# Patient Record
Sex: Female | Born: 1945 | Race: White | Hispanic: No | Smoking: Former smoker
Health system: Southern US, Community
[De-identification: ages and names within clinical notes are randomized; demographics above are authoritative.]

## PROBLEM LIST (undated history)

## (undated) DIAGNOSIS — Z8701 Personal history of pneumonia (recurrent): Secondary | ICD-10-CM

## (undated) DIAGNOSIS — Z8709 Personal history of other diseases of the respiratory system: Secondary | ICD-10-CM

## (undated) DIAGNOSIS — J302 Other seasonal allergic rhinitis: Secondary | ICD-10-CM

## (undated) DIAGNOSIS — Z9109 Other allergy status, other than to drugs and biological substances: Secondary | ICD-10-CM

## (undated) DIAGNOSIS — T7840XA Allergy, unspecified, initial encounter: Secondary | ICD-10-CM

## (undated) DIAGNOSIS — Z8739 Personal history of other diseases of the musculoskeletal system and connective tissue: Secondary | ICD-10-CM

## (undated) DIAGNOSIS — L732 Hidradenitis suppurativa: Secondary | ICD-10-CM

## (undated) DIAGNOSIS — M1990 Unspecified osteoarthritis, unspecified site: Secondary | ICD-10-CM

## (undated) DIAGNOSIS — Z860101 Personal history of adenomatous and serrated colon polyps: Secondary | ICD-10-CM

## (undated) DIAGNOSIS — Z8614 Personal history of Methicillin resistant Staphylococcus aureus infection: Secondary | ICD-10-CM

## (undated) DIAGNOSIS — R011 Cardiac murmur, unspecified: Secondary | ICD-10-CM

## (undated) DIAGNOSIS — M8580 Other specified disorders of bone density and structure, unspecified site: Secondary | ICD-10-CM

## (undated) DIAGNOSIS — K5790 Diverticulosis of intestine, part unspecified, without perforation or abscess without bleeding: Secondary | ICD-10-CM

## (undated) DIAGNOSIS — C801 Malignant (primary) neoplasm, unspecified: Secondary | ICD-10-CM

## (undated) DIAGNOSIS — J45909 Unspecified asthma, uncomplicated: Secondary | ICD-10-CM

## (undated) DIAGNOSIS — E785 Hyperlipidemia, unspecified: Secondary | ICD-10-CM

## (undated) DIAGNOSIS — R8271 Bacteriuria: Secondary | ICD-10-CM

## (undated) DIAGNOSIS — I10 Essential (primary) hypertension: Secondary | ICD-10-CM

## (undated) DIAGNOSIS — Z86010 Personal history of colonic polyps: Secondary | ICD-10-CM

## (undated) DIAGNOSIS — Z780 Asymptomatic menopausal state: Secondary | ICD-10-CM

## (undated) HISTORY — DX: Personal history of pneumonia (recurrent): Z87.01

## (undated) HISTORY — DX: Allergy, unspecified, initial encounter: T78.40XA

## (undated) HISTORY — DX: Hyperlipidemia, unspecified: E78.5

## (undated) HISTORY — DX: Personal history of adenomatous and serrated colon polyps: Z86.0101

## (undated) HISTORY — PX: TONSILLECTOMY: SHX5217

## (undated) HISTORY — DX: Diverticulosis of intestine, part unspecified, without perforation or abscess without bleeding: K57.90

## (undated) HISTORY — DX: Other seasonal allergic rhinitis: J30.2

## (undated) HISTORY — DX: Other allergy status, other than to drugs and biological substances: Z91.09

## (undated) HISTORY — DX: Hidradenitis suppurativa: L73.2

## (undated) HISTORY — DX: Malignant (primary) neoplasm, unspecified: C80.1

## (undated) HISTORY — DX: Unspecified asthma, uncomplicated: J45.909

## (undated) HISTORY — DX: Cardiac murmur, unspecified: R01.1

## (undated) HISTORY — DX: Essential (primary) hypertension: I10

## (undated) HISTORY — DX: Personal history of colonic polyps: Z86.010

## (undated) HISTORY — DX: Personal history of Methicillin resistant Staphylococcus aureus infection: Z86.14

## (undated) HISTORY — DX: Personal history of other diseases of the respiratory system: Z87.09

## (undated) HISTORY — DX: Asymptomatic menopausal state: Z78.0

## (undated) HISTORY — DX: Unspecified osteoarthritis, unspecified site: M19.90

## (undated) HISTORY — DX: Personal history of other diseases of the musculoskeletal system and connective tissue: Z87.39

## (undated) HISTORY — DX: Bacteriuria: R82.71

---
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
## 1998-08-27 DIAGNOSIS — Z780 Asymptomatic menopausal state: Secondary | ICD-10-CM

## 1998-08-27 HISTORY — DX: Asymptomatic menopausal state: Z78.0

## 2003-08-28 DIAGNOSIS — Z8614 Personal history of Methicillin resistant Staphylococcus aureus infection: Secondary | ICD-10-CM

## 2003-08-28 DIAGNOSIS — L732 Hidradenitis suppurativa: Secondary | ICD-10-CM

## 2003-08-28 HISTORY — DX: Hidradenitis suppurativa: L73.2

## 2003-08-28 HISTORY — DX: Personal history of Methicillin resistant Staphylococcus aureus infection: Z86.14

## 2007-08-28 DIAGNOSIS — Z8739 Personal history of other diseases of the musculoskeletal system and connective tissue: Secondary | ICD-10-CM

## 2007-08-28 DIAGNOSIS — M1990 Unspecified osteoarthritis, unspecified site: Secondary | ICD-10-CM

## 2007-08-28 HISTORY — PX: MENISCUS REPAIR: SHX5179

## 2007-08-28 HISTORY — DX: Personal history of other diseases of the musculoskeletal system and connective tissue: Z87.39

## 2007-08-28 HISTORY — DX: Unspecified osteoarthritis, unspecified site: M19.90

## 2012-02-21 DIAGNOSIS — M1710 Unilateral primary osteoarthritis, unspecified knee: Secondary | ICD-10-CM | POA: Diagnosis not present

## 2012-05-07 DIAGNOSIS — Z23 Encounter for immunization: Secondary | ICD-10-CM | POA: Diagnosis not present

## 2012-05-27 DIAGNOSIS — I10 Essential (primary) hypertension: Secondary | ICD-10-CM | POA: Diagnosis not present

## 2012-05-27 DIAGNOSIS — E782 Mixed hyperlipidemia: Secondary | ICD-10-CM | POA: Diagnosis not present

## 2012-05-27 LAB — COMPREHENSIVE METABOLIC PANEL
ALT: 18 U/L (ref 7–35)
AST: 18 U/L
Alkaline Phosphatase: 78 U/L
Glucose: 87

## 2012-05-27 LAB — LIPID PANEL
Direct LDL: 93
HDL: 77 mg/dL — AB (ref 35–70)

## 2012-05-27 LAB — URIC ACID: Uric Acid: 5.7

## 2012-05-27 LAB — CBC: WBC: 5.7

## 2012-05-28 DIAGNOSIS — I10 Essential (primary) hypertension: Secondary | ICD-10-CM | POA: Diagnosis not present

## 2012-05-28 DIAGNOSIS — J301 Allergic rhinitis due to pollen: Secondary | ICD-10-CM | POA: Diagnosis not present

## 2012-05-28 DIAGNOSIS — E782 Mixed hyperlipidemia: Secondary | ICD-10-CM | POA: Diagnosis not present

## 2012-11-27 ENCOUNTER — Encounter: Payer: Self-pay | Admitting: Family Medicine

## 2012-11-27 ENCOUNTER — Ambulatory Visit (INDEPENDENT_AMBULATORY_CARE_PROVIDER_SITE_OTHER): Payer: Medicare Other | Admitting: Family Medicine

## 2012-11-27 VITALS — BP 130/80 | HR 72 | Temp 98.3°F | Ht 64.0 in | Wt 198.2 lb

## 2012-11-27 DIAGNOSIS — I10 Essential (primary) hypertension: Secondary | ICD-10-CM | POA: Diagnosis not present

## 2012-11-27 DIAGNOSIS — M129 Arthropathy, unspecified: Secondary | ICD-10-CM | POA: Diagnosis not present

## 2012-11-27 DIAGNOSIS — J302 Other seasonal allergic rhinitis: Secondary | ICD-10-CM | POA: Insufficient documentation

## 2012-11-27 DIAGNOSIS — J309 Allergic rhinitis, unspecified: Secondary | ICD-10-CM

## 2012-11-27 DIAGNOSIS — E785 Hyperlipidemia, unspecified: Secondary | ICD-10-CM | POA: Insufficient documentation

## 2012-11-27 DIAGNOSIS — M1990 Unspecified osteoarthritis, unspecified site: Secondary | ICD-10-CM

## 2012-11-27 MED ORDER — EZETIMIBE-SIMVASTATIN 10-20 MG PO TABS: 1.0000 | ORAL_TABLET | Freq: Every day | ORAL | Status: DC

## 2012-11-27 MED ORDER — MONTELUKAST SODIUM 10 MG PO TABS: 10.0000 mg | ORAL_TABLET | Freq: Every day | ORAL | Status: DC

## 2012-11-27 NOTE — Progress Notes (Signed)
Subjective:    Patient ID: Victoria Morrison, female    DOB: 15-Nov-1945, 67 y.o.   MRN: [id number]  HPI CC: new pt to establish  Received medicare 1 year ago (12/2011).  Has not had welcome to medicare visit.  Brings CD of records which I will review. Recently moved from [place].  HTN - tolerant of benicar and amlodipine.  Dx about 10 yrs ago. HLD - compliant with vytorin daily.  No myalgias.  Allergic rhinitis, seasonal - on singulair nightly for this.  Interested in backing off this med. H/o asthma - no problems in last 30 yrs.  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  Has been tested for lupus - negative. On MTX until 2005, then stopped 2/2 MRSA infection on skin of abdomen. H/o significant knee arthritis H/o synvis shots into knees 1 year ago (2013) Saw Rheumatologist Dr. Harley Hallmark in [place].  Requests referral back to rheum. Pain currently controlled with advil prn.  Lives with husband, Victoria Morrison. Occupation: retired, was Estate manager/land agent Edu: college Activity: walks regularly, gym Diet: good water, vegetables daily  Preventative: Last physical 10/2011, last blood work 05/2012 Last mammogram 11/2011 Birads 2.  Strong fmhx breast cancer. Well woman with PCP 10/2011, normal pap G4P4 DEXA ordered 10/2011 but unclear if done - no records of this in CD she brings Tetanus - unsure - no evidence of updated Td in records she brings Pneumovax - 05/07/2012 Flu - 05/07/2012 Shingles shot - interested.  Medications and allergies reviewed and updated in chart.  Past histories reviewed and updated if relevant as below. Patient Active Problem List  Diagnosis  . Asymptomatic bacteriuria  . Seasonal allergic rhinitis  . HTN (hypertension)  . HLD (hyperlipidemia)   Past Medical History  Diagnosis Date  . Arthritis     ?rheumatoid, s/p synvisc injections x3  . Seasonal allergic rhinitis   . HTN  (hypertension)   . HLD (hyperlipidemia)   . Environmental allergies     dust; pet dander  . History of MRSA infection 2005    abdomen  . Asymptomatic bacteriuria   . History of pneumonia 1980s  . History of asthma 1980s  . Hydradenitis 2005   Past Surgical History  Procedure Laterality Date  . Appendectomy  1954  . Meniscus repair  2009    Left  . Tonsillectomy      childhood   History  Substance Use Topics  . Smoking status: Former Smoker    Quit date: 08/27/1968  . Smokeless tobacco: Never Used  . Alcohol Use: Yes     Comment: Regular-wine 2-3   Family History  Problem Relation Age of Onset  . Cancer Mother 48    breast  . Cancer Maternal Grandmother 33    breast (or unsure)  . Cancer Brother 77    lung (smoker)  . Alcoholism Father   . CAD Father 19    MI  . Stroke Neg Hx   . Diabetes Neg Hx    Allergies  Allergen Reactions  . Asa (Aspirin) Hives    Ok to take a few-but not too many  . Penicillins Hives   No current outpatient prescriptions on file prior to visit.   No current facility-administered medications on file prior to visit.     Review of Systems  Constitutional: Negative for fever, chills, activity change, appetite change, fatigue and unexpected weight change.  HENT: Negative for hearing  loss and neck pain.   Eyes: Negative for visual disturbance.  Respiratory: Negative for cough, chest tightness, shortness of breath and wheezing.   Cardiovascular: Negative for chest pain, palpitations and leg swelling.  Gastrointestinal: Negative for nausea, vomiting, abdominal pain, diarrhea, constipation, blood in stool and abdominal distention.  Genitourinary: Negative for hematuria and difficulty urinating.  Musculoskeletal: Negative for myalgias and arthralgias.  Skin: Negative for rash.  Neurological: Negative for dizziness, seizures, syncope and headaches.  Hematological: Negative for adenopathy. Does not bruise/bleed easily.   Psychiatric/Behavioral: Negative for dysphoric mood. The patient is not nervous/anxious.        Objective:   Physical Exam  Nursing note and vitals reviewed. Constitutional: She is oriented to person, place, and time. She appears well-developed and well-nourished. No distress.  HENT:  Head: Normocephalic and atraumatic.  Right Ear: Hearing, tympanic membrane, external ear and ear canal normal.  Left Ear: Hearing, tympanic membrane, external ear and ear canal normal.  Nose: Nose normal.  Mouth/Throat: Oropharynx is clear and moist. No oropharyngeal exudate.  Eyes: Conjunctivae and EOM are normal. Pupils are equal, round, and reactive to light. No scleral icterus.  Neck: Normal range of motion. Neck supple. Carotid bruit is not present. No thyromegaly present.  Cardiovascular: Normal rate, regular rhythm, normal heart sounds and intact distal pulses.   No murmur heard. Pulses:      Radial pulses are 2+ on the right side, and 2+ on the left side.  Pulmonary/Chest: Effort normal and breath sounds normal. No respiratory distress. She has no wheezes. She has no rales.  Abdominal: Soft. Bowel sounds are normal. She exhibits no distension and no mass. There is no tenderness. There is no rebound and no guarding.  Musculoskeletal: Normal range of motion. She exhibits no edema.  Lymphadenopathy:    She has no cervical adenopathy.  Neurological: She is alert and oriented to person, place, and time.  CN grossly intact, station and gait intact  Skin: Skin is warm and dry. No rash noted.  Psychiatric: She has a normal mood and affect. Her behavior is normal. Judgment and thought content normal.       Assessment & Plan:

## 2012-11-27 NOTE — Patient Instructions (Signed)
Return at your convenience within the month for welcome to medicare visit, prior fasting for blood work. Call your insurance about the shingles shot to see if it is covered or how much it would cost and where is cheaper (here or pharmacy).  If you want to receive here, call for nurse visit. meds refilled today Pass by Marion's office for referral to rheumatologist. Good to see you today, call us with questions.

## 2012-11-29 ENCOUNTER — Encounter: Payer: Self-pay | Admitting: Family Medicine

## 2012-11-29 NOTE — Assessment & Plan Note (Signed)
Controlled on singulair - continue

## 2012-11-29 NOTE — Assessment & Plan Note (Signed)
Never formally diagnosed with rheumatoid arthritis, but has been told RA-like arthritis, and prior on MTX. Will refer to rheum to establish for this per pt request. Currently pain controlled with advil prn.

## 2012-11-29 NOTE — Assessment & Plan Note (Signed)
Chronic, stable on vytorin.  Asked to scan/input latest blood work. Recheck when returns for physical.

## 2012-11-29 NOTE — Assessment & Plan Note (Signed)
Chronic, stable. Compliant with meds and tolerating well - continue. Good control today.

## 2012-12-01 ENCOUNTER — Other Ambulatory Visit (INDEPENDENT_AMBULATORY_CARE_PROVIDER_SITE_OTHER): Payer: Federal, State, Local not specified - PPO

## 2012-12-01 ENCOUNTER — Other Ambulatory Visit: Payer: Self-pay | Admitting: Family Medicine

## 2012-12-01 DIAGNOSIS — E785 Hyperlipidemia, unspecified: Secondary | ICD-10-CM | POA: Diagnosis not present

## 2012-12-01 DIAGNOSIS — I10 Essential (primary) hypertension: Secondary | ICD-10-CM

## 2012-12-01 LAB — LIPID PANEL
HDL: 72.8 mg/dL (ref >39.00)
Total CHOL/HDL Ratio: 2
VLDL: 17.8 mg/dL (ref 0.0–40.0)

## 2012-12-01 LAB — COMPREHENSIVE METABOLIC PANEL
ALT: 20 U/L (ref 0–35)
Albumin: 4.2 g/dL (ref 3.5–5.2)
Alkaline Phosphatase: 76 U/L (ref 39–117)
CO2: 24 mEq/L (ref 19–32)
GFR: 72.12 mL/min (ref >60.00)
Glucose, Bld: 87 mg/dL (ref 70–99)
Potassium: 4.2 mEq/L (ref 3.5–5.1)
Sodium: 141 mEq/L (ref 135–145)
Total Bilirubin: 0.7 mg/dL (ref 0.3–1.2)
Total Protein: 7.4 g/dL (ref 6.0–8.3)

## 2012-12-01 LAB — TSH: TSH: 0.68 u[IU]/mL (ref 0.35–5.50)

## 2012-12-04 ENCOUNTER — Encounter: Payer: Self-pay | Admitting: Family Medicine

## 2012-12-04 ENCOUNTER — Ambulatory Visit (INDEPENDENT_AMBULATORY_CARE_PROVIDER_SITE_OTHER): Payer: Medicare Other | Admitting: Family Medicine

## 2012-12-04 VITALS — BP 116/72 | HR 64 | Temp 98.3°F | Ht 64.75 in | Wt 187.5 lb

## 2012-12-04 DIAGNOSIS — Z23 Encounter for immunization: Secondary | ICD-10-CM

## 2012-12-04 DIAGNOSIS — Z0000 Encounter for general adult medical examination without abnormal findings: Secondary | ICD-10-CM

## 2012-12-04 DIAGNOSIS — Z139 Encounter for screening, unspecified: Secondary | ICD-10-CM

## 2012-12-04 DIAGNOSIS — Z1231 Encounter for screening mammogram for malignant neoplasm of breast: Secondary | ICD-10-CM

## 2012-12-04 DIAGNOSIS — E2839 Other primary ovarian failure: Secondary | ICD-10-CM

## 2012-12-04 DIAGNOSIS — Z1211 Encounter for screening for malignant neoplasm of colon: Secondary | ICD-10-CM | POA: Diagnosis not present

## 2012-12-04 NOTE — Progress Notes (Signed)
Subjective:    Patient ID: Victoria Morrison, female    DOB: [birth year], 67 y.o.   MRN: [id number]  HPI CC: welcome to medicare visit.  Lives with husband, [name].  Occupation: retired, was Estate manager/land agent  Edu: college  Activity: walks regularly, gym  Diet: good water, vegetables daily   Normal hearing and vision screens today. Denies depression, sadness, anhedonia.  No falls in last year.  Seat belt use discussed Sunscreen use discussed  Preventative:  Last physical 10/2011, last blood work 05/2012  Last mammogram 11/2011 Birads 2. Strong fmhx breast cancer.  Colon cancer screening - last 2004, normal per pt.  Prefers iFOB. Well woman with PCP 10/2011, normal pap.  Would like to skip this year and do every 2 years. G4P4  DEXA ordered 10/2011 but never done.  Would like to set up. Tetanus - unsure - no evidence of updated Td in records she brings. Pneumovax - 05/07/2012  Flu - 05/07/2012  Shingles shot - interested in this today - checked and told 100% coverage. Advanced directives - knows husband has one - he has DNR.  Would want husband then daughter as HCPOA.  Medications and allergies reviewed and updated in chart.  Past histories reviewed and updated if relevant as below. Patient Active Problem List  Diagnosis  . Seasonal allergic rhinitis  . HTN (hypertension)  . HLD (hyperlipidemia)  . Arthritis  . Welcome to Medicare preventive visit   Past Medical History  Diagnosis Date  . Arthritis     ?rheumatoid, s/p synvisc knee injections x3  . Seasonal allergic rhinitis   . HTN (hypertension)   . HLD (hyperlipidemia)   . Environmental allergies     dust; pet dander; pollen  . History of MRSA infection 2005    abdomen  . Asymptomatic bacteriuria   . History of pneumonia 1980s  . History of asthma 1980s  . Hydradenitis 2005  . Diverticulosis   . Postmenopausal 2000   Past Surgical History  Procedure Laterality Date  . Appendectomy  1954  . Meniscus repair  2009    Left   . Tonsillectomy      childhood   History  Substance Use Topics  . Smoking status: Former Smoker    Quit date: 08/27/1968  . Smokeless tobacco: Never Used  . Alcohol Use: Yes     Comment: Regular-wine 2-3   Family History  Problem Relation Age of Onset  . Cancer Mother 15    breast  . Cancer Maternal Grandmother 24    breast (or unsure)  . Cancer Brother 90    lung (smoker)  . Alcoholism Father   . CAD Father 28    MI  . Stroke Neg Hx   . Diabetes Neg Hx    Allergies  Allergen Reactions  . Asa (Aspirin) Hives    Ok to take a few-but not too many  . Penicillins Hives   Current Outpatient Prescriptions on File Prior to Visit  Medication Sig Dispense Refill  . amLODipine (NORVASC) 10 MG tablet Take 10 mg by mouth daily.      Marland Kitchen ezetimibe-simvastatin (VYTORIN) 10-20 MG per tablet Take 1 tablet by mouth at bedtime.  90 tablet  3  . montelukast (SINGULAIR) 10 MG tablet Take 1 tablet (10 mg total) by mouth at bedtime.  90 tablet  3  . olmesartan (BENICAR) 20 MG tablet Take 20 mg by mouth daily.       No current facility-administered medications on file prior to visit.  Review of Systems  Constitutional: Negative for fever, chills, activity change, appetite change, fatigue and unexpected weight change.  HENT: Negative for hearing loss and neck pain.   Eyes: Negative for visual disturbance.  Respiratory: Negative for cough, chest tightness, shortness of breath and wheezing.   Cardiovascular: Negative for chest pain, palpitations and leg swelling.  Gastrointestinal: Negative for nausea, vomiting, abdominal pain, diarrhea, constipation, blood in stool and abdominal distention.  Genitourinary: Negative for hematuria and difficulty urinating.  Musculoskeletal: Negative for myalgias and arthralgias.  Skin: Negative for rash.  Neurological: Negative for dizziness, seizures, syncope and headaches.  Hematological: Negative for adenopathy. Does not bruise/bleed easily.   Psychiatric/Behavioral: Negative for dysphoric mood. The patient is not nervous/anxious.        Objective:   Physical Exam  Nursing note and vitals reviewed. Constitutional: She is oriented to person, place, and time. She appears well-developed and well-nourished. No distress.  HENT:  Head: Normocephalic and atraumatic.  Right Ear: Hearing, tympanic membrane, external ear and ear canal normal.  Left Ear: Hearing, tympanic membrane, external ear and ear canal normal.  Nose: Nose normal.  Mouth/Throat: Oropharynx is clear and moist. No oropharyngeal exudate.  Eyes: Conjunctivae and EOM are normal. Pupils are equal, round, and reactive to light. No scleral icterus.  Neck: Normal range of motion. Neck supple. Carotid bruit is not present. No thyromegaly present.  Cardiovascular: Normal rate, regular rhythm, normal heart sounds and intact distal pulses.   No murmur heard. Pulses:      Radial pulses are 2+ on the right side, and 2+ on the left side.  Pulmonary/Chest: Effort normal and breath sounds normal. No respiratory distress. She has no wheezes. She has no rales. Right breast exhibits no inverted nipple, no mass, no nipple discharge, no skin change and no tenderness. Left breast exhibits no inverted nipple, no mass, no nipple discharge, no skin change and no tenderness. Breasts are symmetrical.  Abdominal: Soft. Bowel sounds are normal. She exhibits no distension and no mass. There is no tenderness. There is no rebound and no guarding.  Musculoskeletal: Normal range of motion. She exhibits no edema.  Lymphadenopathy:    She has no cervical adenopathy.    She has no axillary adenopathy.       Right axillary: No lateral adenopathy present.       Left axillary: No lateral adenopathy present.      Right: No supraclavicular adenopathy present.       Left: No supraclavicular adenopathy present.  Neurological: She is alert and oriented to person, place, and time.  CN grossly intact, station  and gait intact  Skin: Skin is warm and dry. No rash noted.  Psychiatric: She has a normal mood and affect. Her behavior is normal. Judgment and thought content normal.      Assessment & Plan:

## 2012-12-04 NOTE — Addendum Note (Signed)
Addended by: Josph Macho A on: 12/04/2012 04:45 PM   Modules accepted: Orders

## 2012-12-04 NOTE — Assessment & Plan Note (Signed)
I have personally reviewed the Medicare Annual Wellness questionnaire and have noted 1. The patient's medical and social history 2. Their use of alcohol, tobacco or illicit drugs 3. Their current medications and supplements 4. The patient's functional ability including ADL's, fall risks, home safety risks and hearing or visual impairment. 5. Diet and physical activity 6. Evidence for depression or mood disorders The patients weight, height, BMI have been recorded in the chart.  Hearing and vision has been addressed. I have made referrals, counseling and provided education to the patient based review of the above and I have provided the pt with a written personalized care plan for preventive services. See scanned questionairre. Advanced directives discussed: requests packet.  Reviewed preventative protocols and updated unless pt declined. Schedule mammo and dexa. ifob today.  EKG - NSR. WNL

## 2012-12-04 NOTE — Patient Instructions (Addendum)
Pass by Marion's office to schedule screening mammogram. Advanced directives packet provided today. Stool kit today. Good to see you, call us with questions.

## 2012-12-05 ENCOUNTER — Encounter: Payer: Self-pay | Admitting: Family Medicine

## 2012-12-08 ENCOUNTER — Encounter: Payer: Self-pay | Admitting: Family Medicine

## 2012-12-09 ENCOUNTER — Other Ambulatory Visit: Payer: Federal, State, Local not specified - PPO

## 2012-12-09 DIAGNOSIS — Z1211 Encounter for screening for malignant neoplasm of colon: Secondary | ICD-10-CM

## 2012-12-09 LAB — FECAL OCCULT BLOOD, IMMUNOCHEMICAL: Fecal Occult Bld: NEGATIVE

## 2012-12-10 ENCOUNTER — Encounter: Payer: Self-pay | Admitting: *Deleted

## 2012-12-25 DIAGNOSIS — M899 Disorder of bone, unspecified: Secondary | ICD-10-CM | POA: Insufficient documentation

## 2012-12-25 DIAGNOSIS — M8580 Other specified disorders of bone density and structure, unspecified site: Secondary | ICD-10-CM

## 2012-12-25 HISTORY — PX: OTHER SURGICAL HISTORY: SHX169

## 2012-12-25 HISTORY — DX: Other specified disorders of bone density and structure, unspecified site: M85.80

## 2012-12-26 DIAGNOSIS — M1710 Unilateral primary osteoarthritis, unspecified knee: Secondary | ICD-10-CM | POA: Diagnosis not present

## 2012-12-26 DIAGNOSIS — M069 Rheumatoid arthritis, unspecified: Secondary | ICD-10-CM | POA: Diagnosis not present

## 2013-01-01 ENCOUNTER — Ambulatory Visit
Admission: RE | Admit: 2013-01-01 | Discharge: 2013-01-01 | Disposition: A | Discharge status: Home / Self Care | Payer: Federal, State, Local not specified - PPO | Source: Ambulatory Visit | Attending: Family Medicine | Admitting: Family Medicine

## 2013-01-01 ENCOUNTER — Ambulatory Visit
Admission: RE | Admit: 2013-01-01 | Discharge: 2013-01-01 | Disposition: A | Discharge status: Home / Self Care | Payer: Medicare Other | Source: Ambulatory Visit | Attending: Family Medicine | Admitting: Family Medicine

## 2013-01-01 DIAGNOSIS — Z1231 Encounter for screening mammogram for malignant neoplasm of breast: Secondary | ICD-10-CM

## 2013-01-01 DIAGNOSIS — M949 Disorder of cartilage, unspecified: Secondary | ICD-10-CM | POA: Diagnosis not present

## 2013-01-01 DIAGNOSIS — E2839 Other primary ovarian failure: Secondary | ICD-10-CM

## 2013-01-01 HISTORY — DX: Other specified disorders of bone density and structure, unspecified site: M85.80

## 2013-01-05 ENCOUNTER — Encounter: Payer: Self-pay | Admitting: *Deleted

## 2013-01-23 ENCOUNTER — Encounter: Payer: Self-pay | Admitting: Family Medicine

## 2013-01-28 ENCOUNTER — Encounter: Payer: Self-pay | Admitting: Family Medicine

## 2013-02-05 DIAGNOSIS — M19049 Primary osteoarthritis, unspecified hand: Secondary | ICD-10-CM | POA: Diagnosis not present

## 2013-02-05 DIAGNOSIS — M1710 Unilateral primary osteoarthritis, unspecified knee: Secondary | ICD-10-CM | POA: Diagnosis not present

## 2013-02-05 DIAGNOSIS — M064 Inflammatory polyarthropathy: Secondary | ICD-10-CM | POA: Diagnosis not present

## 2013-02-22 ENCOUNTER — Encounter: Payer: Self-pay | Admitting: Family Medicine

## 2013-02-23 ENCOUNTER — Encounter: Payer: Self-pay | Admitting: Family Medicine

## 2013-02-24 MED ORDER — AMLODIPINE BESYLATE 10 MG PO TABS: 10.0000 mg | ORAL_TABLET | Freq: Every day | ORAL | Status: DC

## 2013-02-24 MED ORDER — OLMESARTAN MEDOXOMIL 20 MG PO TABS: 20.0000 mg | ORAL_TABLET | Freq: Every day | ORAL | Status: DC

## 2013-05-15 ENCOUNTER — Ambulatory Visit (INDEPENDENT_AMBULATORY_CARE_PROVIDER_SITE_OTHER): Payer: Medicare Other

## 2013-05-15 DIAGNOSIS — Z23 Encounter for immunization: Secondary | ICD-10-CM

## 2013-06-19 ENCOUNTER — Encounter: Payer: Self-pay | Admitting: Family Medicine

## 2013-06-19 NOTE — Telephone Encounter (Signed)
Appt scheduled with patient

## 2013-06-30 ENCOUNTER — Encounter: Payer: Self-pay | Admitting: Family Medicine

## 2013-06-30 ENCOUNTER — Ambulatory Visit (INDEPENDENT_AMBULATORY_CARE_PROVIDER_SITE_OTHER): Payer: Medicare Other | Admitting: Family Medicine

## 2013-06-30 VITALS — BP 130/70 | HR 72 | Temp 98.4°F | Wt 198.2 lb

## 2013-06-30 DIAGNOSIS — I10 Essential (primary) hypertension: Secondary | ICD-10-CM | POA: Diagnosis not present

## 2013-06-30 DIAGNOSIS — R209 Unspecified disturbances of skin sensation: Secondary | ICD-10-CM | POA: Insufficient documentation

## 2013-06-30 DIAGNOSIS — M129 Arthropathy, unspecified: Secondary | ICD-10-CM | POA: Diagnosis not present

## 2013-06-30 DIAGNOSIS — E785 Hyperlipidemia, unspecified: Secondary | ICD-10-CM | POA: Diagnosis not present

## 2013-06-30 DIAGNOSIS — M1990 Unspecified osteoarthritis, unspecified site: Secondary | ICD-10-CM

## 2013-06-30 DIAGNOSIS — R200 Anesthesia of skin: Secondary | ICD-10-CM

## 2013-06-30 LAB — LIPID PANEL
LDL Cholesterol: 93 mg/dL (ref 0–99)
Total CHOL/HDL Ratio: 2
VLDL: 20.6 mg/dL (ref 0.0–40.0)

## 2013-06-30 LAB — COMPREHENSIVE METABOLIC PANEL
AST: 19 U/L (ref 0–37)
Albumin: 4.2 g/dL (ref 3.5–5.2)
Alkaline Phosphatase: 75 U/L (ref 39–117)
Potassium: 4.6 mEq/L (ref 3.5–5.1)
Sodium: 139 mEq/L (ref 135–145)
Total Protein: 7.3 g/dL (ref 6.0–8.3)

## 2013-06-30 MED ORDER — MELOXICAM 15 MG PO TABS: ORAL_TABLET | ORAL | Status: DC

## 2013-06-30 NOTE — Progress Notes (Signed)
  Subjective:    Patient ID: Victoria Morrison, female    DOB: 1946/01/05, 67 y.o.   MRN: [id number]  HPI CC: discuss arthritis pain  Some finger swelling noted over last year, worsening over last few months.  Last week started noticing nodules on fingers - especially R pinky at PIP.  Occasional R pinky and L PIP middle finger gets warm and red.  Also notices R 2nd and 3rd toes go numb when wearing shoes.  No foot pain.    Hasn't tried anything for this so far.  Prior on meloxicam for arthritis but didn't significantly help.  Has seen [facility] Gavin Potters at [facility] (rheum).  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 Has been tested for lupus - negative.  On MTX until 2005, then stopped 2/2 MRSA infection on skin of abdomen.  H/o significant knee arthritis  H/o synvis shots into knees 1 year ago (2013)  Saw Rheumatologist Dr. Harley Hallmark in [place]. Requests referral back to rheum.  Pain currently controlled with advil prn.  Past Medical History  Diagnosis Date  . Seasonal allergic rhinitis   . HTN (hypertension)   . HLD (hyperlipidemia)   . Environmental allergies     dust; pet dander; pollen  . History of MRSA infection 2005    abdomen  . Asymptomatic bacteriuria   . History of pneumonia 1980s  . History of asthma 1980s  . Hydradenitis 2005  . Diverticulosis   . Postmenopausal 2000  . Rheumatoid arthritis(714.0) 2009    in remission, off MTX since 2005  . Osteoarthritis 2009    bilateral hands/knees s/p synvisc injections x3 ([facility])  . Osteopenia 12/2012    dexa with T score -1.9     Review of Systems Per HPI    Objective:   Physical Exam  Nursing note and vitals reviewed. Constitutional: She appears well-developed and well-nourished. No distress.  Musculoskeletal: She exhibits edema.  Bilateral hands with mild swelling at DIP and distal tips of fingers.  Small nodule palpable right 5th PIP.  Mild erythema present.  No marked pain.  FROM  2+ DP  bilaterally R foot - mildly tender to palpation at 2nd/3rd mid metatarsal, small nodule palpable.         Assessment & Plan:

## 2013-06-30 NOTE — Assessment & Plan Note (Signed)
Anticipate osteoarthritis - encouraged daily squeezing exercises of hands and will start course of NSAID x 1 wk then PRN (GI precautions discussed). Check ESR - if elevated, low threshold to return to Dr. Gavin Potters sooner than scheduled appt next month. Pt agrees with plan.

## 2013-06-30 NOTE — Patient Instructions (Signed)
Blood work today to check on hand inflammation - I will fax to Dr. Gavin Potters I wonder if right foot numbness coming from neuroma - if becoming bothersome, would refer you to podiatrist. Let us know if questions. Return in 6 months for medicare wellness visit, sooner if needed

## 2013-06-30 NOTE — Assessment & Plan Note (Signed)
?  morton neuroma.  Not currently bothersome to patient - will monitor and notify if worsening discomfort for referral to podiatry/ortho

## 2013-07-01 ENCOUNTER — Other Ambulatory Visit: Payer: Self-pay | Admitting: Family Medicine

## 2013-08-07 DIAGNOSIS — M1710 Unilateral primary osteoarthritis, unspecified knee: Secondary | ICD-10-CM | POA: Diagnosis not present

## 2013-08-12 DIAGNOSIS — M659 Synovitis and tenosynovitis, unspecified: Secondary | ICD-10-CM | POA: Diagnosis not present

## 2013-08-12 DIAGNOSIS — Q6689 Other  specified congenital deformities of feet: Secondary | ICD-10-CM | POA: Diagnosis not present

## 2013-08-14 ENCOUNTER — Encounter: Payer: Self-pay | Admitting: Family Medicine

## 2013-08-19 DIAGNOSIS — M659 Synovitis and tenosynovitis, unspecified: Secondary | ICD-10-CM | POA: Diagnosis not present

## 2013-08-19 DIAGNOSIS — Q6689 Other  specified congenital deformities of feet: Secondary | ICD-10-CM | POA: Diagnosis not present

## 2013-08-25 ENCOUNTER — Encounter: Payer: Self-pay | Admitting: Family Medicine

## 2013-09-09 DIAGNOSIS — Q6689 Other  specified congenital deformities of feet: Secondary | ICD-10-CM | POA: Diagnosis not present

## 2013-09-09 DIAGNOSIS — M659 Synovitis and tenosynovitis, unspecified: Secondary | ICD-10-CM | POA: Diagnosis not present

## 2013-09-14 ENCOUNTER — Other Ambulatory Visit: Payer: Self-pay | Admitting: Family Medicine

## 2013-09-27 HISTORY — PX: MEDIAL PARTIAL KNEE REPLACEMENT: SHX5965

## 2013-10-08 ENCOUNTER — Ambulatory Visit: Payer: Self-pay | Admitting: Unknown Physician Specialty

## 2013-10-08 DIAGNOSIS — IMO0002 Reserved for concepts with insufficient information to code with codable children: Secondary | ICD-10-CM | POA: Diagnosis not present

## 2013-10-08 DIAGNOSIS — M25569 Pain in unspecified knee: Secondary | ICD-10-CM | POA: Diagnosis not present

## 2013-10-08 DIAGNOSIS — M1710 Unilateral primary osteoarthritis, unspecified knee: Secondary | ICD-10-CM | POA: Diagnosis not present

## 2013-10-12 DIAGNOSIS — I119 Hypertensive heart disease without heart failure: Secondary | ICD-10-CM | POA: Diagnosis not present

## 2013-10-14 DIAGNOSIS — IMO0002 Reserved for concepts with insufficient information to code with codable children: Secondary | ICD-10-CM | POA: Diagnosis not present

## 2013-10-14 DIAGNOSIS — M1710 Unilateral primary osteoarthritis, unspecified knee: Secondary | ICD-10-CM | POA: Diagnosis present

## 2013-10-14 DIAGNOSIS — Z87891 Personal history of nicotine dependence: Secondary | ICD-10-CM | POA: Diagnosis not present

## 2013-10-14 DIAGNOSIS — I10 Essential (primary) hypertension: Secondary | ICD-10-CM | POA: Diagnosis present

## 2013-10-14 DIAGNOSIS — M25569 Pain in unspecified knee: Secondary | ICD-10-CM | POA: Diagnosis present

## 2013-10-17 DIAGNOSIS — Z96659 Presence of unspecified artificial knee joint: Secondary | ICD-10-CM | POA: Diagnosis not present

## 2013-10-17 DIAGNOSIS — Z7901 Long term (current) use of anticoagulants: Secondary | ICD-10-CM | POA: Diagnosis not present

## 2013-10-17 DIAGNOSIS — Z471 Aftercare following joint replacement surgery: Secondary | ICD-10-CM | POA: Diagnosis not present

## 2013-10-17 DIAGNOSIS — IMO0001 Reserved for inherently not codable concepts without codable children: Secondary | ICD-10-CM | POA: Diagnosis not present

## 2013-10-19 DIAGNOSIS — IMO0002 Reserved for concepts with insufficient information to code with codable children: Secondary | ICD-10-CM | POA: Diagnosis not present

## 2013-10-19 DIAGNOSIS — M1710 Unilateral primary osteoarthritis, unspecified knee: Secondary | ICD-10-CM | POA: Diagnosis not present

## 2013-10-20 DIAGNOSIS — Z471 Aftercare following joint replacement surgery: Secondary | ICD-10-CM | POA: Diagnosis not present

## 2013-10-20 DIAGNOSIS — Z7901 Long term (current) use of anticoagulants: Secondary | ICD-10-CM | POA: Diagnosis not present

## 2013-10-20 DIAGNOSIS — IMO0001 Reserved for inherently not codable concepts without codable children: Secondary | ICD-10-CM | POA: Diagnosis not present

## 2013-10-20 DIAGNOSIS — Z96659 Presence of unspecified artificial knee joint: Secondary | ICD-10-CM | POA: Diagnosis not present

## 2013-10-21 DIAGNOSIS — IMO0001 Reserved for inherently not codable concepts without codable children: Secondary | ICD-10-CM | POA: Diagnosis not present

## 2013-10-21 DIAGNOSIS — Z96659 Presence of unspecified artificial knee joint: Secondary | ICD-10-CM | POA: Diagnosis not present

## 2013-10-21 DIAGNOSIS — Z471 Aftercare following joint replacement surgery: Secondary | ICD-10-CM | POA: Diagnosis not present

## 2013-10-21 DIAGNOSIS — Z7901 Long term (current) use of anticoagulants: Secondary | ICD-10-CM | POA: Diagnosis not present

## 2013-10-23 DIAGNOSIS — IMO0001 Reserved for inherently not codable concepts without codable children: Secondary | ICD-10-CM | POA: Diagnosis not present

## 2013-10-23 DIAGNOSIS — Z7901 Long term (current) use of anticoagulants: Secondary | ICD-10-CM | POA: Diagnosis not present

## 2013-10-23 DIAGNOSIS — Z471 Aftercare following joint replacement surgery: Secondary | ICD-10-CM | POA: Diagnosis not present

## 2013-10-23 DIAGNOSIS — Z96659 Presence of unspecified artificial knee joint: Secondary | ICD-10-CM | POA: Diagnosis not present

## 2013-10-24 DIAGNOSIS — Z96659 Presence of unspecified artificial knee joint: Secondary | ICD-10-CM | POA: Diagnosis not present

## 2013-10-24 DIAGNOSIS — IMO0001 Reserved for inherently not codable concepts without codable children: Secondary | ICD-10-CM | POA: Diagnosis not present

## 2013-10-24 DIAGNOSIS — Z471 Aftercare following joint replacement surgery: Secondary | ICD-10-CM | POA: Diagnosis not present

## 2013-10-24 DIAGNOSIS — Z7901 Long term (current) use of anticoagulants: Secondary | ICD-10-CM | POA: Diagnosis not present

## 2013-10-26 DIAGNOSIS — Z471 Aftercare following joint replacement surgery: Secondary | ICD-10-CM | POA: Diagnosis not present

## 2013-10-26 DIAGNOSIS — Z96659 Presence of unspecified artificial knee joint: Secondary | ICD-10-CM | POA: Diagnosis not present

## 2013-10-26 DIAGNOSIS — Z7901 Long term (current) use of anticoagulants: Secondary | ICD-10-CM | POA: Diagnosis not present

## 2013-10-26 DIAGNOSIS — IMO0001 Reserved for inherently not codable concepts without codable children: Secondary | ICD-10-CM | POA: Diagnosis not present

## 2013-10-28 DIAGNOSIS — IMO0001 Reserved for inherently not codable concepts without codable children: Secondary | ICD-10-CM | POA: Diagnosis not present

## 2013-10-28 DIAGNOSIS — Z96659 Presence of unspecified artificial knee joint: Secondary | ICD-10-CM | POA: Diagnosis not present

## 2013-10-28 DIAGNOSIS — Z7901 Long term (current) use of anticoagulants: Secondary | ICD-10-CM | POA: Diagnosis not present

## 2013-10-28 DIAGNOSIS — Z471 Aftercare following joint replacement surgery: Secondary | ICD-10-CM | POA: Diagnosis not present

## 2013-10-30 DIAGNOSIS — Z7901 Long term (current) use of anticoagulants: Secondary | ICD-10-CM | POA: Diagnosis not present

## 2013-10-30 DIAGNOSIS — IMO0001 Reserved for inherently not codable concepts without codable children: Secondary | ICD-10-CM | POA: Diagnosis not present

## 2013-10-30 DIAGNOSIS — Z471 Aftercare following joint replacement surgery: Secondary | ICD-10-CM | POA: Diagnosis not present

## 2013-10-30 DIAGNOSIS — Z96659 Presence of unspecified artificial knee joint: Secondary | ICD-10-CM | POA: Diagnosis not present

## 2013-11-02 ENCOUNTER — Other Ambulatory Visit: Payer: Self-pay | Admitting: Family Medicine

## 2013-11-02 DIAGNOSIS — Z96659 Presence of unspecified artificial knee joint: Secondary | ICD-10-CM | POA: Diagnosis not present

## 2013-11-02 DIAGNOSIS — IMO0001 Reserved for inherently not codable concepts without codable children: Secondary | ICD-10-CM | POA: Diagnosis not present

## 2013-11-02 DIAGNOSIS — Z7901 Long term (current) use of anticoagulants: Secondary | ICD-10-CM | POA: Diagnosis not present

## 2013-11-02 DIAGNOSIS — Z471 Aftercare following joint replacement surgery: Secondary | ICD-10-CM | POA: Diagnosis not present

## 2013-11-04 DIAGNOSIS — Z471 Aftercare following joint replacement surgery: Secondary | ICD-10-CM | POA: Diagnosis not present

## 2013-11-04 DIAGNOSIS — Z7901 Long term (current) use of anticoagulants: Secondary | ICD-10-CM | POA: Diagnosis not present

## 2013-11-04 DIAGNOSIS — IMO0001 Reserved for inherently not codable concepts without codable children: Secondary | ICD-10-CM | POA: Diagnosis not present

## 2013-11-04 DIAGNOSIS — Z96659 Presence of unspecified artificial knee joint: Secondary | ICD-10-CM | POA: Diagnosis not present

## 2013-11-06 DIAGNOSIS — Z7901 Long term (current) use of anticoagulants: Secondary | ICD-10-CM | POA: Diagnosis not present

## 2013-11-06 DIAGNOSIS — IMO0001 Reserved for inherently not codable concepts without codable children: Secondary | ICD-10-CM | POA: Diagnosis not present

## 2013-11-06 DIAGNOSIS — Z96659 Presence of unspecified artificial knee joint: Secondary | ICD-10-CM | POA: Diagnosis not present

## 2013-11-06 DIAGNOSIS — Z471 Aftercare following joint replacement surgery: Secondary | ICD-10-CM | POA: Diagnosis not present

## 2013-11-09 DIAGNOSIS — Z96659 Presence of unspecified artificial knee joint: Secondary | ICD-10-CM | POA: Diagnosis not present

## 2013-11-09 DIAGNOSIS — IMO0001 Reserved for inherently not codable concepts without codable children: Secondary | ICD-10-CM | POA: Diagnosis not present

## 2013-11-09 DIAGNOSIS — Z471 Aftercare following joint replacement surgery: Secondary | ICD-10-CM | POA: Diagnosis not present

## 2013-11-09 DIAGNOSIS — Z7901 Long term (current) use of anticoagulants: Secondary | ICD-10-CM | POA: Diagnosis not present

## 2013-11-11 DIAGNOSIS — Z96659 Presence of unspecified artificial knee joint: Secondary | ICD-10-CM | POA: Diagnosis not present

## 2013-11-11 DIAGNOSIS — Z471 Aftercare following joint replacement surgery: Secondary | ICD-10-CM | POA: Diagnosis not present

## 2013-11-11 DIAGNOSIS — IMO0001 Reserved for inherently not codable concepts without codable children: Secondary | ICD-10-CM | POA: Diagnosis not present

## 2013-11-11 DIAGNOSIS — Z7901 Long term (current) use of anticoagulants: Secondary | ICD-10-CM | POA: Diagnosis not present

## 2013-11-12 ENCOUNTER — Encounter: Payer: Self-pay | Admitting: Family Medicine

## 2013-11-12 DIAGNOSIS — M25569 Pain in unspecified knee: Secondary | ICD-10-CM | POA: Diagnosis not present

## 2013-11-12 DIAGNOSIS — M25669 Stiffness of unspecified knee, not elsewhere classified: Secondary | ICD-10-CM | POA: Diagnosis not present

## 2013-11-12 DIAGNOSIS — M6281 Muscle weakness (generalized): Secondary | ICD-10-CM | POA: Diagnosis not present

## 2013-11-16 DIAGNOSIS — Z96659 Presence of unspecified artificial knee joint: Secondary | ICD-10-CM | POA: Diagnosis not present

## 2013-11-16 DIAGNOSIS — Z471 Aftercare following joint replacement surgery: Secondary | ICD-10-CM | POA: Diagnosis not present

## 2013-11-16 DIAGNOSIS — Z7901 Long term (current) use of anticoagulants: Secondary | ICD-10-CM | POA: Diagnosis not present

## 2013-11-16 DIAGNOSIS — IMO0001 Reserved for inherently not codable concepts without codable children: Secondary | ICD-10-CM | POA: Diagnosis not present

## 2013-11-18 DIAGNOSIS — M25669 Stiffness of unspecified knee, not elsewhere classified: Secondary | ICD-10-CM | POA: Diagnosis not present

## 2013-11-18 DIAGNOSIS — M25569 Pain in unspecified knee: Secondary | ICD-10-CM | POA: Diagnosis not present

## 2013-11-18 DIAGNOSIS — M6281 Muscle weakness (generalized): Secondary | ICD-10-CM | POA: Diagnosis not present

## 2013-11-20 DIAGNOSIS — M25569 Pain in unspecified knee: Secondary | ICD-10-CM | POA: Diagnosis not present

## 2013-11-20 DIAGNOSIS — M6281 Muscle weakness (generalized): Secondary | ICD-10-CM | POA: Diagnosis not present

## 2013-11-20 DIAGNOSIS — M25669 Stiffness of unspecified knee, not elsewhere classified: Secondary | ICD-10-CM | POA: Diagnosis not present

## 2013-11-23 DIAGNOSIS — M25669 Stiffness of unspecified knee, not elsewhere classified: Secondary | ICD-10-CM | POA: Diagnosis not present

## 2013-11-23 DIAGNOSIS — M25569 Pain in unspecified knee: Secondary | ICD-10-CM | POA: Diagnosis not present

## 2013-11-23 DIAGNOSIS — M6281 Muscle weakness (generalized): Secondary | ICD-10-CM | POA: Diagnosis not present

## 2013-11-26 DIAGNOSIS — M6281 Muscle weakness (generalized): Secondary | ICD-10-CM | POA: Diagnosis not present

## 2013-11-26 DIAGNOSIS — M25569 Pain in unspecified knee: Secondary | ICD-10-CM | POA: Diagnosis not present

## 2013-11-26 DIAGNOSIS — M25669 Stiffness of unspecified knee, not elsewhere classified: Secondary | ICD-10-CM | POA: Diagnosis not present

## 2013-11-28 ENCOUNTER — Other Ambulatory Visit: Payer: Self-pay | Admitting: Family Medicine

## 2013-11-28 ENCOUNTER — Encounter: Payer: Self-pay | Admitting: Family Medicine

## 2013-11-28 DIAGNOSIS — E785 Hyperlipidemia, unspecified: Secondary | ICD-10-CM

## 2013-11-28 DIAGNOSIS — M1990 Unspecified osteoarthritis, unspecified site: Secondary | ICD-10-CM | POA: Insufficient documentation

## 2013-11-28 DIAGNOSIS — I10 Essential (primary) hypertension: Secondary | ICD-10-CM

## 2013-11-28 DIAGNOSIS — M8580 Other specified disorders of bone density and structure, unspecified site: Secondary | ICD-10-CM

## 2013-12-01 DIAGNOSIS — M6281 Muscle weakness (generalized): Secondary | ICD-10-CM | POA: Diagnosis not present

## 2013-12-01 DIAGNOSIS — M25669 Stiffness of unspecified knee, not elsewhere classified: Secondary | ICD-10-CM | POA: Diagnosis not present

## 2013-12-01 DIAGNOSIS — M25569 Pain in unspecified knee: Secondary | ICD-10-CM | POA: Diagnosis not present

## 2013-12-02 ENCOUNTER — Other Ambulatory Visit (INDEPENDENT_AMBULATORY_CARE_PROVIDER_SITE_OTHER): Payer: Medicare Other

## 2013-12-02 DIAGNOSIS — E785 Hyperlipidemia, unspecified: Secondary | ICD-10-CM

## 2013-12-02 DIAGNOSIS — M949 Disorder of cartilage, unspecified: Secondary | ICD-10-CM | POA: Diagnosis not present

## 2013-12-02 DIAGNOSIS — M8580 Other specified disorders of bone density and structure, unspecified site: Secondary | ICD-10-CM

## 2013-12-02 DIAGNOSIS — M899 Disorder of bone, unspecified: Secondary | ICD-10-CM | POA: Diagnosis not present

## 2013-12-02 DIAGNOSIS — I10 Essential (primary) hypertension: Secondary | ICD-10-CM | POA: Diagnosis not present

## 2013-12-02 LAB — BASIC METABOLIC PANEL
BUN: 13 mg/dL (ref 6–23)
CO2: 26 mEq/L (ref 19–32)
Calcium: 9.3 mg/dL (ref 8.4–10.5)
Chloride: 107 mEq/L (ref 96–112)
Creatinine, Ser: 0.8 mg/dL (ref 0.4–1.2)
GFR: 81.94 mL/min (ref >60.00)
GLUCOSE: 91 mg/dL (ref 70–99)
POTASSIUM: 4.2 meq/L (ref 3.5–5.1)
SODIUM: 141 meq/L (ref 135–145)

## 2013-12-02 LAB — TSH: TSH: 0.94 u[IU]/mL (ref 0.35–5.50)

## 2013-12-02 LAB — LDL CHOLESTEROL, DIRECT: Direct LDL: 69.3 mg/dL

## 2013-12-03 DIAGNOSIS — M25669 Stiffness of unspecified knee, not elsewhere classified: Secondary | ICD-10-CM | POA: Diagnosis not present

## 2013-12-03 DIAGNOSIS — M6281 Muscle weakness (generalized): Secondary | ICD-10-CM | POA: Diagnosis not present

## 2013-12-03 DIAGNOSIS — M25569 Pain in unspecified knee: Secondary | ICD-10-CM | POA: Diagnosis not present

## 2013-12-03 LAB — VITAMIN D 25 HYDROXY (VIT D DEFICIENCY, FRACTURES): VIT D 25 HYDROXY: 48 ng/mL (ref 30–89)

## 2013-12-04 ENCOUNTER — Other Ambulatory Visit: Payer: Self-pay

## 2013-12-04 DIAGNOSIS — Z1231 Encounter for screening mammogram for malignant neoplasm of breast: Secondary | ICD-10-CM

## 2013-12-09 ENCOUNTER — Other Ambulatory Visit (HOSPITAL_COMMUNITY)
Admission: RE | Admit: 2013-12-09 | Discharge: 2013-12-09 | Disposition: A | Discharge status: Home / Self Care | Payer: Medicare Other | Source: Ambulatory Visit | Attending: Family Medicine | Admitting: Family Medicine

## 2013-12-09 ENCOUNTER — Encounter: Payer: Self-pay | Admitting: Family Medicine

## 2013-12-09 ENCOUNTER — Ambulatory Visit (INDEPENDENT_AMBULATORY_CARE_PROVIDER_SITE_OTHER): Payer: Medicare Other | Admitting: Family Medicine

## 2013-12-09 ENCOUNTER — Other Ambulatory Visit: Payer: Self-pay | Admitting: Family Medicine

## 2013-12-09 VITALS — BP 118/64 | HR 84 | Temp 98.1°F | Ht 64.75 in | Wt 188.5 lb

## 2013-12-09 DIAGNOSIS — M8580 Other specified disorders of bone density and structure, unspecified site: Secondary | ICD-10-CM

## 2013-12-09 DIAGNOSIS — I10 Essential (primary) hypertension: Secondary | ICD-10-CM

## 2013-12-09 DIAGNOSIS — Z01419 Encounter for gynecological examination (general) (routine) without abnormal findings: Secondary | ICD-10-CM | POA: Insufficient documentation

## 2013-12-09 DIAGNOSIS — Z124 Encounter for screening for malignant neoplasm of cervix: Secondary | ICD-10-CM

## 2013-12-09 DIAGNOSIS — Z1211 Encounter for screening for malignant neoplasm of colon: Secondary | ICD-10-CM

## 2013-12-09 DIAGNOSIS — Z1151 Encounter for screening for human papillomavirus (HPV): Secondary | ICD-10-CM | POA: Diagnosis not present

## 2013-12-09 DIAGNOSIS — Z23 Encounter for immunization: Secondary | ICD-10-CM

## 2013-12-09 DIAGNOSIS — M899 Disorder of bone, unspecified: Secondary | ICD-10-CM

## 2013-12-09 DIAGNOSIS — Z0000 Encounter for general adult medical examination without abnormal findings: Secondary | ICD-10-CM | POA: Diagnosis not present

## 2013-12-09 DIAGNOSIS — M949 Disorder of cartilage, unspecified: Secondary | ICD-10-CM

## 2013-12-09 DIAGNOSIS — E785 Hyperlipidemia, unspecified: Secondary | ICD-10-CM

## 2013-12-09 MED ORDER — EZETIMIBE 10 MG PO TABS: 10.0000 mg | ORAL_TABLET | Freq: Every day | ORAL | Status: DC

## 2013-12-09 NOTE — Progress Notes (Signed)
Pre visit review using our clinic review tool, if applicable. No additional management support is needed unless otherwise documented below in the visit note. 

## 2013-12-09 NOTE — Assessment & Plan Note (Signed)
Chronic, stable. Always well controlled, pt desires trial of lower dose of med so will decrease amlodipine to 1/2 tablet daily and she will monitor readings and notify me if persistently elevated.

## 2013-12-09 NOTE — Assessment & Plan Note (Signed)
Chronic, stable. Discussed benefits of statin as most effective to decrease cardiovascular risk.  Regardless, pt requests trial off statin so will try zetia for next 3 months and she will return for FLP at that time.

## 2013-12-09 NOTE — Progress Notes (Signed)
BP 118/64  Pulse 84  Temp(Src) 98.1 F (36.7 C) (Oral)  Ht 5' 4.75" (1.645 m)  Wt 188 lb 8 oz (85.503 kg)  BMI 31.60 kg/m2   CC: medicare wellness visit  Subjective:    Patient ID: Victoria Morrison, female    DOB: 07-28-1946, 68 y.o.   MRN: [id number]  HPI: Victoria Morrison is a 68 y.o. female presenting on 12/09/2013 for Annual Exam   Reviewed meds she takes including multivitamins  Normal hearing and vision screens today.  Denies depression, sadness, anhedonia. No falls in last year.  Seat belt use discussed  Sunscreen use discussed   Preventative: Last mammogram 11/2011 Birads 2. Strong fmhx breast cancer.  Colon cancer screening - last 2004, normal per pt. Prefers iFOB.  Well woman with PCP 10/2011, normal pap. Would like to skip this year and do every 2 years.  Always normal paps in past.  Mammogram scheduled for 12/2013 G4P4  DEXA Date: 12/2012 T score -1.3 femur, -1.9 radius Tetanus - 11/2012 Pneumovax - 05/07/2012, prevnar today. Flu - 04/2013 Shingles shot - 11/2012 Advanced directives - knows husband has one - he has DNR. Would want husband then daughter as HCPOA.  Lives with husband, [name].  Occupation: retired, was Optician, dispensing  Edu: college  Activity: walks regularly, gym  Diet: good water, vegetables daily   Relevant past medical, surgical, family and social history reviewed and updated as indicated.  Allergies and medications reviewed and updated. Current Outpatient Prescriptions on File Prior to Visit  Medication Sig  . montelukast (SINGULAIR) 10 MG tablet Take 1 tablet (10 mg total) by mouth at bedtime.  . cholecalciferol (VITAMIN D) 1000 UNITS tablet Take by mouth daily. Gets 3200 IU vit D daily through multivitamins   No current facility-administered medications on file prior to visit.    Review of Systems Per HPI unless specifically indicated above    Objective:    BP 118/64  Pulse 84  Temp(Src) 98.1 F (36.7 C) (Oral)  Ht 5' 4.75" (1.645 m)  Wt 188  lb 8 oz (85.503 kg)  BMI 31.60 kg/m2  Physical Exam  Nursing note and vitals reviewed. Constitutional: She is oriented to person, place, and time. She appears well-developed and well-nourished. No distress.  HENT:  Head: Normocephalic and atraumatic.  Right Ear: Hearing, tympanic membrane, external ear and ear canal normal.  Left Ear: Hearing, tympanic membrane, external ear and ear canal normal.  Nose: Nose normal.  Mouth/Throat: Uvula is midline, oropharynx is clear and moist and mucous membranes are normal. No oropharyngeal exudate, posterior oropharyngeal edema or posterior oropharyngeal erythema.  Eyes: Conjunctivae and EOM are normal. Pupils are equal, round, and reactive to light. No scleral icterus.  Neck: Normal range of motion. Neck supple. Carotid bruit is not present. No thyromegaly present.  Cardiovascular: Normal rate, regular rhythm, normal heart sounds and intact distal pulses.   No murmur heard. Pulses:      Radial pulses are 2+ on the right side, and 2+ on the left side.  Pulmonary/Chest: Effort normal and breath sounds normal. No respiratory distress. She has no wheezes. She has no rales. Right breast exhibits no inverted nipple, no mass, no nipple discharge, no skin change and no tenderness. Left breast exhibits no inverted nipple, no mass, no nipple discharge, no skin change and no tenderness.  Abdominal: Soft. Bowel sounds are normal. She exhibits no distension and no mass. There is no tenderness. There is no rebound and no guarding.  Genitourinary: Vagina normal and  uterus normal. Pelvic exam was performed with patient supine. There is no rash, tenderness or lesion on the right labia. There is no rash, tenderness or lesion on the left labia. Cervix exhibits no motion tenderness, no discharge and no friability. Right adnexum displays no mass, no tenderness and no fullness. Left adnexum displays no mass, no tenderness and no fullness.  Pap performed on cervix    Musculoskeletal: Normal range of motion. She exhibits no edema.  Lymphadenopathy:       Head (right side): No submental, no submandibular, no tonsillar, no preauricular and no posterior auricular adenopathy present.       Head (left side): No submental, no submandibular, no tonsillar, no preauricular and no posterior auricular adenopathy present.    She has no cervical adenopathy.    She has no axillary adenopathy.       Right axillary: No lateral adenopathy present.       Left axillary: No lateral adenopathy present.      Right: No supraclavicular adenopathy present.       Left: No supraclavicular adenopathy present.  Neurological: She is alert and oriented to person, place, and time.  CN grossly intact, station and gait intact  Skin: Skin is warm and dry. No rash noted.  Psychiatric: She has a normal mood and affect. Her behavior is normal. Judgment and thought content normal.   Results for orders placed in visit on 12/02/13  TSH      Result Value Ref Range   TSH 0.94  0.35 - 5.50 uIU/mL  BASIC METABOLIC PANEL      Result Value Ref Range   Sodium 141  135 - 145 mEq/L   Potassium 4.2  3.5 - 5.1 mEq/L   Chloride 107  96 - 112 mEq/L   CO2 26  19 - 32 mEq/L   Glucose, Bld 91  70 - 99 mg/dL   BUN 13  6 - 23 mg/dL   Creatinine, Ser 0.8  0.4 - 1.2 mg/dL   Calcium 9.3  8.4 - 10.5 mg/dL   GFR 81.94  >60.00 mL/min  LDL CHOLESTEROL, DIRECT      Result Value Ref Range   Direct LDL 69.3    VITAMIN D 25 HYDROXY      Result Value Ref Range   Vit D, 25-Hydroxy 48  30 - 89 ng/mL      Assessment & Plan:   Problem List Items Addressed This Visit   HTN (hypertension)     Chronic, stable. Always well controlled, pt desires trial of lower dose of med so will decrease amlodipine to 1/2 tablet daily and she will monitor readings and notify me if persistently elevated.    Relevant Medications      ZETIA 10 MG PO TABS   HLD (hyperlipidemia)     Chronic, stable. Discussed benefits of statin  as most effective to decrease cardiovascular risk.  Regardless, pt requests trial off statin so will try zetia for next 3 months and she will return for FLP at that time.    Relevant Medications      ZETIA 10 MG PO TABS   Medicare annual wellness visit, initial - Primary     I have personally reviewed the Medicare Annual Wellness questionnaire and have noted 1. The patient's medical and social history 2. Their use of alcohol, tobacco or illicit drugs 3. Their current medications and supplements 4. The patient's functional ability including ADL's, fall risks, home safety risks and hearing or visual impairment.  5. Diet and physical activity 6. Evidence for depression or mood disorders The patients weight, height, BMI have been recorded in the chart.  Hearing and vision has been addressed. I have made referrals, counseling and provided education to the patient based review of the above and I have provided the pt with a written personalized care plan for preventive services. See scanned questionairre. Advanced directives discussed previously.  Reviewed preventative protocols and updated unless pt declined. Breast exam today, papsmear/pelvic today iFOB today.    Relevant Orders      Cytology - PAP   Osteopenia     Reviewed ingredients of multivitamins she takes - and reviewed recommended cal/vit D amount daily.     Other Visit Diagnoses   Special screening for malignant neoplasms, colon        Relevant Orders       Fecal occult blood, imunochemical    Pap smear for cervical cancer screening        Relevant Orders       Cytology - PAP    Immunization due        Relevant Orders       Pneumococcal conjugate vaccine 13-valent (Completed)        Follow up plan: Return in about 6 months (around 06/10/2014), or as needed, for follow up visit.

## 2013-12-09 NOTE — Patient Instructions (Addendum)
Pass by lab to pick up stool kit. Prevnar today. Let's stop vytorin, start plain zetia 52m daily.  Return in 3-434monthfor lab visit to check cholesterol levels. May decrease amlodipine to 69m27maily and monitor blood pressures on this dose. Return in 6 months for recheck blood pressure and follow up visit.

## 2013-12-09 NOTE — Assessment & Plan Note (Signed)
I have personally reviewed the Medicare Annual Wellness questionnaire and have noted 1. The patient's medical and social history 2. Their use of alcohol, tobacco or illicit drugs 3. Their current medications and supplements 4. The patient's functional ability including ADL's, fall risks, home safety risks and hearing or visual impairment. 5. Diet and physical activity 6. Evidence for depression or mood disorders The patients weight, height, BMI have been recorded in the chart.  Hearing and vision has been addressed. I have made referrals, counseling and provided education to the patient based review of the above and I have provided the pt with a written personalized care plan for preventive services. See scanned questionairre. Advanced directives discussed previously.  Reviewed preventative protocols and updated unless pt declined. Breast exam today, papsmear/pelvic today iFOB today.

## 2013-12-09 NOTE — Assessment & Plan Note (Signed)
Reviewed ingredients of multivitamins she takes - and reviewed recommended cal/vit D amount daily.

## 2013-12-10 DIAGNOSIS — M25569 Pain in unspecified knee: Secondary | ICD-10-CM | POA: Diagnosis not present

## 2013-12-10 DIAGNOSIS — M6281 Muscle weakness (generalized): Secondary | ICD-10-CM | POA: Diagnosis not present

## 2013-12-10 DIAGNOSIS — M25669 Stiffness of unspecified knee, not elsewhere classified: Secondary | ICD-10-CM | POA: Diagnosis not present

## 2013-12-14 ENCOUNTER — Other Ambulatory Visit: Payer: Self-pay | Admitting: Family Medicine

## 2013-12-14 ENCOUNTER — Encounter: Payer: Self-pay | Admitting: *Deleted

## 2013-12-14 ENCOUNTER — Other Ambulatory Visit: Payer: Medicare Other

## 2013-12-14 DIAGNOSIS — R195 Other fecal abnormalities: Secondary | ICD-10-CM

## 2013-12-14 DIAGNOSIS — Z1211 Encounter for screening for malignant neoplasm of colon: Secondary | ICD-10-CM

## 2013-12-14 LAB — FECAL OCCULT BLOOD, IMMUNOCHEMICAL: FECAL OCCULT BLD: POSITIVE — AB

## 2013-12-15 DIAGNOSIS — Z471 Aftercare following joint replacement surgery: Secondary | ICD-10-CM | POA: Diagnosis not present

## 2013-12-25 ENCOUNTER — Encounter: Payer: Self-pay | Admitting: Gastroenterology

## 2013-12-25 HISTORY — PX: COLONOSCOPY: SHX174

## 2013-12-28 ENCOUNTER — Encounter: Payer: Self-pay | Admitting: Gastroenterology

## 2013-12-28 ENCOUNTER — Encounter: Payer: Self-pay | Admitting: Family Medicine

## 2013-12-28 ENCOUNTER — Ambulatory Visit (INDEPENDENT_AMBULATORY_CARE_PROVIDER_SITE_OTHER): Payer: Medicare Other | Admitting: Gastroenterology

## 2013-12-28 VITALS — BP 118/62 | HR 77 | Ht 64.75 in | Wt 187.3 lb

## 2013-12-28 DIAGNOSIS — R195 Other fecal abnormalities: Secondary | ICD-10-CM | POA: Diagnosis not present

## 2013-12-28 MED ORDER — PEG-KCL-NACL-NASULF-NA ASC-C 100 G PO SOLR: 1.0000 | Freq: Once | ORAL | Status: DC

## 2013-12-28 NOTE — Progress Notes (Signed)
    History of Present Illness: This is a 68 year old female with Hemoccult positive stool. She states she underwent colonoscopy in March 2004 in [place] and that exam was unremarkable. She has no gastrointestinal complaints. Denies weight loss, abdominal pain, constipation, diarrhea, change in stool caliber, melena, hematochezia, nausea, vomiting, dysphagia, reflux symptoms, chest pain.  Review of Systems: Pertinent positive and negative review of systems were noted in the above HPI section. All other review of systems were otherwise negative.  Current Medications, Allergies, Past Medical History, Past Surgical History, Family History and Social History were reviewed in [employer] record.  Physical Exam: General: Well developed , well nourished, no acute distress Head: Normocephalic and atraumatic Eyes:  sclerae anicteric, EOMI Ears: Normal auditory acuity Mouth: No deformity or lesions Neck: Supple, no masses or thyromegaly Lungs: Clear throughout to auscultation Heart: Regular rate and rhythm; no murmurs, rubs or bruits Abdomen: Soft, non tender and non distended. No masses, hepatosplenomegaly or hernias noted. Normal Bowel sounds Rectal: Deferred to colonoscopy Musculoskeletal: Symmetrical with no gross deformities  Skin: No lesions on visible extremities Pulses:  Normal pulses noted Extremities: No clubbing, cyanosis, edema or deformities noted Neurological: Alert oriented x 4, grossly nonfocal Cervical Nodes:  No significant cervical adenopathy Inguinal Nodes: No significant inguinal adenopathy Psychological:  Alert and cooperative. Normal mood and affect  Assessment and Recommendations:  1. Hemoccult-positive stool. Rule out colorectal neoplasms. Schedule colonoscopy. The risks, benefits, and alternatives to colonoscopy with possible biopsy and possible polypectomy were discussed with the patient and they consent to proceed.

## 2013-12-28 NOTE — Patient Instructions (Signed)
You have been scheduled for a colonoscopy with propofol. Please follow written instructions given to you at your visit today.  Please pick up your prep kit at the pharmacy within the next 1-3 days. If you use inhalers (even only as needed), please bring them with you on the day of your procedure. Your physician has requested that you go to www.startemmi.com and enter the access code given to you at your visit today. This web site gives a general overview about your procedure. However, you should still follow specific instructions given to you by our office regarding your preparation for the procedure.  Thank you for choosing me and [facility].  Malcolm T. Stark, Jr., MD., FACG  

## 2014-01-01 ENCOUNTER — Ambulatory Visit (AMBULATORY_SURGERY_CENTER): Payer: Medicare Other | Admitting: Gastroenterology

## 2014-01-01 ENCOUNTER — Encounter: Payer: Self-pay | Admitting: Gastroenterology

## 2014-01-01 VITALS — BP 110/68 | HR 59 | Temp 97.5°F | Resp 18 | Ht 64.75 in | Wt 187.0 lb

## 2014-01-01 DIAGNOSIS — R195 Other fecal abnormalities: Secondary | ICD-10-CM | POA: Diagnosis not present

## 2014-01-01 DIAGNOSIS — D126 Benign neoplasm of colon, unspecified: Secondary | ICD-10-CM

## 2014-01-01 DIAGNOSIS — I10 Essential (primary) hypertension: Secondary | ICD-10-CM | POA: Diagnosis not present

## 2014-01-01 DIAGNOSIS — Z6831 Body mass index (BMI) 31.0-31.9, adult: Secondary | ICD-10-CM | POA: Diagnosis not present

## 2014-01-01 MED ORDER — SODIUM CHLORIDE 0.9 % IV SOLN: 500.0000 mL | INTRAVENOUS | Status: DC

## 2014-01-01 NOTE — Progress Notes (Signed)
  [facility] Post-op Note  Patient: Victoria Morrison  Procedure(s) Performed: colonoscopy  Patient Location: [facility] - Recovery Area  Anesthesia Type: Deep Sedation/Propofol  Level of Consciousness: awake, oriented and patient cooperative  Airway and Oxygen Therapy: Patient Spontanous Breathing  Post-op Pain: none  Post-op Assessment:  Post-op Vital signs reviewed, Patient's Cardiovascular Status Stable, Respiratory Function Stable, Patent Airway, No signs of Nausea or vomiting and Pain level controlled  Post-op Vital Signs: Reviewed and stable  Complications: No apparent anesthesia complications  Stephie Xu E Tiamarie Furnari 2:27 PM

## 2014-01-01 NOTE — Progress Notes (Signed)
Called to room to assist during endoscopic procedure.  Patient ID and intended procedure confirmed with present staff. Received instructions for my participation in the procedure from the performing physician.  

## 2014-01-01 NOTE — Op Note (Signed)
[place]  [address]. [place], [id number]   COLONOSCOPY PROCEDURE REPORT PATIENT: Victoria Morrison, Victoria Morrison  MR#: [id number] BIRTHDATE: 05-22-1946 , 69  yrs. old GENDER: Female ENDOSCOPIST: Ladene Artist, MD, [facility] REFERRED RC:BULAGT Danise Mina, M.D. PROCEDURE DATE:  01/01/2014 PROCEDURE:   Colonoscopy with snare polypectomy First Screening Colonoscopy - Avg.  risk and is 50 yrs.  old or older - No.  Prior Negative Screening - Now for repeat screening. N/A  History of Adenoma - Now for follow-up colonoscopy & has been > or = to 3 yrs.  N/A  Polyps Removed Today? Yes. ASA CLASS:   Class II INDICATIONS:heme-positive stool. MEDICATIONS: MAC sedation, administered by CRNA and propofol (Diprivan) 300mg  IV DESCRIPTION OF PROCEDURE:   After the risks benefits and alternatives of the procedure were thoroughly explained, informed consent was obtained.  A digital rectal exam revealed no abnormalities of the rectum.   The LB [id number]  endoscope was introduced through the anus and advanced to the cecum, which was identified by both the appendix and ileocecal valve. No adverse events experienced.   The quality of the prep was good, using MoviPrep  The instrument was then slowly withdrawn as the colon was fully examined.  COLON FINDINGS: Three sessile polyps measuring 5-7 mm in size were found in the transverse colon.  A polypectomy was performed with a cold snare.  The resection was complete and the polyp tissue was completely retrieved.   A semi-pedunculated polyp measuring 7 mm in size was found in the descending colon at the edge of a diverticulum.  A polypectomy was performed with a cold snare.  The resection was complete and the polyp tissue was completely retrieved.   Moderate diverticulosis was noted in the descending colon and sigmoid colon.   The colon was otherwise normal.  There was no diverticulosis, inflammation, polyps or cancers unless previously stated.   Retroflexion was not performed due to a narrow rectal vault. The time to cecum=5 minutes 38 seconds.  Withdrawal time=13 minutes 40 seconds.  The scope was withdrawn and the procedure completed. COMPLICATIONS: There were no complications.  ENDOSCOPIC IMPRESSION: 1.   Three sessile polyps measuring 5-7 mm in the transverse colon; polypectomy performed with a cold snare 2.   Semi-pedunculated polyp measuring 7 mm in the descending colon; polypectomy performed with a cold snare 3.   Moderate diverticulosis in the descending colon and sigmoid colon  RECOMMENDATIONS: 1.  Await pathology results 2.  Repeat colonoscopy in 3 years if 3 or 4 polyps are adenomatous; 5 years if 1-2 are adenomatous; otherwise 10 years 3.  High fiber diet with liberal fluid intake.  eSigned:  Ladene Artist, MD, [facility] 01/01/2014 2:27 PM

## 2014-01-01 NOTE — Patient Instructions (Signed)

## 2014-01-04 ENCOUNTER — Telehealth: Payer: Self-pay | Admitting: *Deleted

## 2014-01-04 ENCOUNTER — Ambulatory Visit
Admission: RE | Admit: 2014-01-04 | Discharge: 2014-01-04 | Disposition: A | Discharge status: Home / Self Care | Payer: Medicare Other | Source: Ambulatory Visit

## 2014-01-04 ENCOUNTER — Encounter: Payer: Self-pay | Admitting: Family Medicine

## 2014-01-04 DIAGNOSIS — Z1231 Encounter for screening mammogram for malignant neoplasm of breast: Secondary | ICD-10-CM

## 2014-01-04 LAB — HM MAMMOGRAPHY: HM Mammogram: NEGATIVE

## 2014-01-04 NOTE — Telephone Encounter (Signed)
  Follow up Call-  Call back number 01/01/2014  Post procedure Call Back phone  # [phone]  Permission to leave phone message Yes     Patient questions:  Do you have a fever, pain , or abdominal swelling? no Pain Score  0 *  Have you tolerated food without any problems? yes  Have you been able to return to your normal activities? yes  Do you have any questions about your discharge instructions: Diet   no Medications  no Follow up visit  no  Do you have questions or concerns about your Care? no  Actions: * If pain score is 4 or above: No action needed, pain <4.

## 2014-01-05 ENCOUNTER — Encounter: Payer: Self-pay | Admitting: *Deleted

## 2014-01-05 ENCOUNTER — Encounter: Payer: Self-pay | Admitting: Gastroenterology

## 2014-01-07 ENCOUNTER — Encounter: Payer: Self-pay | Admitting: Family Medicine

## 2014-02-04 ENCOUNTER — Other Ambulatory Visit: Payer: Self-pay | Admitting: *Deleted

## 2014-02-04 ENCOUNTER — Encounter: Payer: Self-pay | Admitting: Family Medicine

## 2014-02-04 MED ORDER — EZETIMIBE 10 MG PO TABS: 10.0000 mg | ORAL_TABLET | Freq: Every day | ORAL | Status: DC

## 2014-02-09 DIAGNOSIS — H25019 Cortical age-related cataract, unspecified eye: Secondary | ICD-10-CM | POA: Diagnosis not present

## 2014-03-12 ENCOUNTER — Other Ambulatory Visit: Payer: Self-pay | Admitting: Family Medicine

## 2014-04-06 DIAGNOSIS — Z96659 Presence of unspecified artificial knee joint: Secondary | ICD-10-CM | POA: Diagnosis not present

## 2014-04-09 ENCOUNTER — Ambulatory Visit (INDEPENDENT_AMBULATORY_CARE_PROVIDER_SITE_OTHER): Payer: Medicare Other | Admitting: Family Medicine

## 2014-04-09 ENCOUNTER — Other Ambulatory Visit: Payer: Self-pay | Admitting: Family Medicine

## 2014-04-09 ENCOUNTER — Encounter: Payer: Self-pay | Admitting: Family Medicine

## 2014-04-09 VITALS — BP 110/64 | HR 84 | Temp 98.2°F | Wt 185.5 lb

## 2014-04-09 DIAGNOSIS — R197 Diarrhea, unspecified: Secondary | ICD-10-CM | POA: Insufficient documentation

## 2014-04-09 LAB — COMPREHENSIVE METABOLIC PANEL
ALBUMIN: 4 g/dL (ref 3.5–5.2)
ALT: 17 U/L (ref 0–35)
AST: 16 U/L (ref 0–37)
Alkaline Phosphatase: 67 U/L (ref 39–117)
BUN: 11 mg/dL (ref 6–23)
CALCIUM: 9.6 mg/dL (ref 8.4–10.5)
CHLORIDE: 99 meq/L (ref 96–112)
CO2: 26 mEq/L (ref 19–32)
Creatinine, Ser: 0.9 mg/dL (ref 0.4–1.2)
GFR: 63.86 mL/min (ref >60.00)
Glucose, Bld: 96 mg/dL (ref 70–99)
POTASSIUM: 4.1 meq/L (ref 3.5–5.1)
Sodium: 133 mEq/L — ABNORMAL LOW (ref 135–145)
Total Bilirubin: 0.9 mg/dL (ref 0.2–1.2)
Total Protein: 7.1 g/dL (ref 6.0–8.3)

## 2014-04-09 LAB — CBC WITH DIFFERENTIAL/PLATELET
Basophils Absolute: 0 10*3/uL (ref 0.0–0.1)
Basophils Relative: 0.3 % (ref 0.0–3.0)
EOS PCT: 0.2 % (ref 0.0–5.0)
Eosinophils Absolute: 0 10*3/uL (ref 0.0–0.7)
HCT: 44.2 % (ref 36.0–46.0)
Hemoglobin: 15 g/dL (ref 12.0–15.0)
LYMPHS PCT: 11.5 % — AB (ref 12.0–46.0)
Lymphs Abs: 1.3 10*3/uL (ref 0.7–4.0)
MCHC: 33.8 g/dL (ref 30.0–36.0)
MCV: 90.2 fl (ref 78.0–100.0)
MONO ABS: 0.5 10*3/uL (ref 0.1–1.0)
Monocytes Relative: 4.6 % (ref 3.0–12.0)
Neutro Abs: 9.1 10*3/uL — ABNORMAL HIGH (ref 1.4–7.7)
Neutrophils Relative %: 83.4 % — ABNORMAL HIGH (ref 43.0–77.0)
PLATELETS: 261 10*3/uL (ref 150.0–400.0)
RBC: 4.91 Mil/uL (ref 3.87–5.11)
RDW: 14.4 % (ref 11.5–15.5)
WBC: 10.9 10*3/uL — AB (ref 4.0–10.5)

## 2014-04-09 MED ORDER — METRONIDAZOLE 500 MG PO TABS: 500.0000 mg | ORAL_TABLET | Freq: Three times a day (TID) | ORAL | Status: DC

## 2014-04-09 MED ORDER — CIPROFLOXACIN HCL 500 MG PO TABS: 500.0000 mg | ORAL_TABLET | Freq: Two times a day (BID) | ORAL | Status: DC

## 2014-04-09 NOTE — Progress Notes (Signed)
BP 110/64  Pulse 84  Temp(Src) 98.2 F (36.8 C) (Oral)  Wt 185 lb 8 oz (84.142 kg)   CC: diarrhea with abd pain Subjective:    Patient ID: Victoria Morrison, female    DOB: Oct 01, 1945, 68 y.o.   MRN: [id number]  HPI: Victoria Morrison is a 68 y.o. female presenting on 04/09/2014 for Diarrhea   3d h/o sharp abdominal pain with cramping upper and lower abdomen and diarrhea several times a day. Also with left flank pain today. abd pain improves with bowel movement. Watery diarrhea. So far has been using essential oils and peppermint tea with no improvement. + chills and occasional headache. Able to keep PO down. Decreased urination.  No nausea/vomiting, fevers, body aches, rashes or joint pains, no sick contacts at home, no new foods, no recent travel. No recent abx use. Uses city water.  Off probiotic. Continues antihypertensives  Relevant past medical, surgical, family and social history reviewed and updated as indicated.  Allergies and medications reviewed and updated. Current Outpatient Prescriptions on File Prior to Visit  Medication Sig  . amLODipine (NORVASC) 10 MG tablet Take 0.5 tablets (5 mg total) by mouth daily.  Marland Kitchen BENICAR 20 MG tablet TAKE 1 TABLET (20 MG TOTAL) BY MOUTH DAILY.  Marland Kitchen ezetimibe (ZETIA) 10 MG tablet Take 1 tablet (10 mg total) by mouth daily.  . Probiotic Product (PROBIOTIC DAILY PO) Take 1 capsule by mouth every other day.   No current facility-administered medications on file prior to visit.    Review of Systems Per HPI unless specifically indicated above    Objective:    BP 110/64  Pulse 84  Temp(Src) 98.2 F (36.8 C) (Oral)  Wt 185 lb 8 oz (84.142 kg)  Physical Exam  Nursing note and vitals reviewed. Constitutional: She appears well-developed and well-nourished. No distress.  HENT:  Mouth/Throat: Oropharynx is clear and moist. No oropharyngeal exudate.  Eyes: Conjunctivae and EOM are normal. Pupils are equal, round, and reactive to light. No scleral  icterus.  Cardiovascular: Normal rate, regular rhythm, normal heart sounds and intact distal pulses.   No murmur heard. Pulmonary/Chest: Effort normal and breath sounds normal. No respiratory distress. She has no wheezes. She has no rales.  Abdominal: Soft. Normal appearance and bowel sounds are normal. She exhibits no distension and no mass. There is no hepatosplenomegaly. There is no tenderness. There is no rigidity, no rebound, no guarding, no CVA tenderness and negative Murphy's sign.  Tender to palpation at left lower back  Musculoskeletal: She exhibits no edema.  Skin: Skin is warm and dry. No pallor.   Results for orders placed in visit on 01/05/14  [facility]      Result Value Ref Range   [facility] Mammogram Birads 1 Negative-Repeat 1 year        Assessment & Plan:   Problem List Items Addressed This Visit   Acute diarrhea - Primary     Acute episode of diarrhea over last 2 days associated with abdominal pain at upper and lower midline and left lateral flank. Known diverticulosis on latest colonoscopy 2014. Anticipate infectious diarrhea - discussed anticipated course of resolution for expected self limiting process. Provided with stool culture kit and cipro 3d course in case diarrhea not improving as expected over next 24-48 hours. Not dehydrated today but discussed symptoms of dehydration to necessitate urgent eval over weekend. Possible diverticulitis - check CBC and CMP today and if leukocytosis will treat with cipro/flagyl course. Pt agrees with plan.  Relevant Orders      Stool culture      Comprehensive metabolic panel      CBC with Differential       Follow up plan: Return if symptoms worsen or fail to improve.

## 2014-04-09 NOTE — Assessment & Plan Note (Signed)
Acute episode of diarrhea over last 2 days associated with abdominal pain at upper and lower midline and left lateral flank. Known diverticulosis on latest colonoscopy 2014. Anticipate infectious diarrhea - discussed anticipated course of resolution for expected self limiting process. Provided with stool culture kit and cipro 3d course in case diarrhea not improving as expected over next 24-48 hours. Not dehydrated today but discussed symptoms of dehydration to necessitate urgent eval over weekend. Possible diverticulitis - check CBC and CMP today and if leukocytosis will treat with cipro/flagyl course. Pt agrees with plan.

## 2014-04-09 NOTE — Patient Instructions (Signed)
Hold benicar until diarrhea has resolved and you're feeling better. Continue to push clear liquids over weekend - should start to slow down over next 1-2 days. Prescription for antibiotics provided today - fill if no improvement into Sunday.  If you fill antibiotic, collect stool culture prior to starting antibiotic and bring in on Monday. I hope you start feeling better!

## 2014-04-09 NOTE — Progress Notes (Signed)
Pre visit review using our clinic review tool, if applicable. No additional management support is needed unless otherwise documented below in the visit note. 

## 2014-04-12 ENCOUNTER — Encounter: Payer: Self-pay | Admitting: Family Medicine

## 2014-04-12 DIAGNOSIS — R197 Diarrhea, unspecified: Secondary | ICD-10-CM | POA: Diagnosis not present

## 2014-04-12 NOTE — Addendum Note (Signed)
Addended by: Ellamae Sia on: 04/12/2014 08:54 AM   Modules accepted: Orders

## 2014-04-16 LAB — STOOL CULTURE

## 2014-04-23 ENCOUNTER — Other Ambulatory Visit (INDEPENDENT_AMBULATORY_CARE_PROVIDER_SITE_OTHER): Payer: Medicare Other

## 2014-04-23 ENCOUNTER — Other Ambulatory Visit: Payer: Self-pay | Admitting: Family Medicine

## 2014-04-23 DIAGNOSIS — Z0000 Encounter for general adult medical examination without abnormal findings: Secondary | ICD-10-CM

## 2014-04-23 DIAGNOSIS — E785 Hyperlipidemia, unspecified: Secondary | ICD-10-CM

## 2014-04-23 DIAGNOSIS — I10 Essential (primary) hypertension: Secondary | ICD-10-CM

## 2014-04-23 DIAGNOSIS — R197 Diarrhea, unspecified: Secondary | ICD-10-CM | POA: Diagnosis not present

## 2014-04-23 DIAGNOSIS — M8580 Other specified disorders of bone density and structure, unspecified site: Secondary | ICD-10-CM

## 2014-04-23 LAB — CBC WITH DIFFERENTIAL/PLATELET
Basophils Absolute: 0.1 10*3/uL (ref 0.0–0.1)
Basophils Relative: 1.4 % (ref 0.0–3.0)
EOS PCT: 3.4 % (ref 0.0–5.0)
Eosinophils Absolute: 0.2 10*3/uL (ref 0.0–0.7)
HEMATOCRIT: 43.5 % (ref 36.0–46.0)
Hemoglobin: 14.4 g/dL (ref 12.0–15.0)
Lymphocytes Relative: 36.2 % (ref 12.0–46.0)
Lymphs Abs: 2.1 10*3/uL (ref 0.7–4.0)
MCHC: 33.2 g/dL (ref 30.0–36.0)
MCV: 91.2 fl (ref 78.0–100.0)
Monocytes Absolute: 0.5 10*3/uL (ref 0.1–1.0)
Monocytes Relative: 8.7 % (ref 3.0–12.0)
Neutro Abs: 3 10*3/uL (ref 1.4–7.7)
Neutrophils Relative %: 50.3 % (ref 43.0–77.0)
Platelets: 324 10*3/uL (ref 150.0–400.0)
RBC: 4.77 Mil/uL (ref 3.87–5.11)
RDW: 14.2 % (ref 11.5–15.5)
WBC: 5.9 10*3/uL (ref 4.0–10.5)

## 2014-04-23 LAB — LIPID PANEL
CHOLESTEROL: 218 mg/dL — AB (ref 0–200)
HDL: 54 mg/dL (ref >39.00)
NonHDL: 164
Total CHOL/HDL Ratio: 4
Triglycerides: 216 mg/dL — ABNORMAL HIGH (ref 0.0–149.0)
VLDL: 43.2 mg/dL — ABNORMAL HIGH (ref 0.0–40.0)

## 2014-04-23 LAB — LDL CHOLESTEROL, DIRECT: Direct LDL: 159.7 mg/dL

## 2014-04-26 ENCOUNTER — Encounter: Payer: Self-pay | Admitting: Family Medicine

## 2014-04-26 MED ORDER — PRAVASTATIN SODIUM 40 MG PO TABS
40.0000 mg | ORAL_TABLET | Freq: Every day | ORAL | Status: DC
Start: 2014-04-26 — End: 2014-05-20

## 2014-04-26 NOTE — Telephone Encounter (Signed)
plz call to schedule f/u lab visit in 3-4 months

## 2014-04-27 ENCOUNTER — Telehealth: Payer: Self-pay | Admitting: Family Medicine

## 2014-04-27 NOTE — Telephone Encounter (Signed)
Pt states that she was told to call back and speak to [facility]. I asked her if she would like to go ahead and schedule appts but she wanted to speak with CMA first.

## 2014-04-27 NOTE — Telephone Encounter (Signed)
Message left for patient to call and schedule lab appt. Also notified via [facility].

## 2014-04-27 NOTE — Telephone Encounter (Signed)
Spoke with patient and lab appt scheduled. 

## 2014-05-15 ENCOUNTER — Other Ambulatory Visit: Payer: Self-pay | Admitting: Family Medicine

## 2014-05-19 ENCOUNTER — Encounter: Payer: Self-pay | Admitting: Family Medicine

## 2014-05-20 MED ORDER — PRAVASTATIN SODIUM 40 MG PO TABS: 40.0000 mg | ORAL_TABLET | Freq: Every day | ORAL | Status: DC

## 2014-06-04 ENCOUNTER — Encounter: Payer: Self-pay | Admitting: Family Medicine

## 2014-06-07 ENCOUNTER — Ambulatory Visit (INDEPENDENT_AMBULATORY_CARE_PROVIDER_SITE_OTHER): Payer: Medicare Other

## 2014-06-07 DIAGNOSIS — Z23 Encounter for immunization: Secondary | ICD-10-CM | POA: Diagnosis not present

## 2014-06-10 ENCOUNTER — Ambulatory Visit: Payer: Medicare Other | Admitting: Family Medicine

## 2014-06-22 DIAGNOSIS — Z96651 Presence of right artificial knee joint: Secondary | ICD-10-CM | POA: Diagnosis not present

## 2014-06-22 DIAGNOSIS — Z96652 Presence of left artificial knee joint: Secondary | ICD-10-CM | POA: Diagnosis not present

## 2014-07-24 ENCOUNTER — Other Ambulatory Visit: Payer: Self-pay | Admitting: Family Medicine

## 2014-07-24 DIAGNOSIS — E785 Hyperlipidemia, unspecified: Secondary | ICD-10-CM

## 2014-07-29 ENCOUNTER — Other Ambulatory Visit (INDEPENDENT_AMBULATORY_CARE_PROVIDER_SITE_OTHER): Payer: Medicare Other

## 2014-07-29 DIAGNOSIS — E785 Hyperlipidemia, unspecified: Secondary | ICD-10-CM | POA: Diagnosis not present

## 2014-07-29 DIAGNOSIS — J309 Allergic rhinitis, unspecified: Secondary | ICD-10-CM | POA: Insufficient documentation

## 2014-07-29 DIAGNOSIS — I10 Essential (primary) hypertension: Secondary | ICD-10-CM | POA: Insufficient documentation

## 2014-07-29 DIAGNOSIS — E7849 Other hyperlipidemia: Secondary | ICD-10-CM | POA: Insufficient documentation

## 2014-07-29 DIAGNOSIS — M216X1 Other acquired deformities of right foot: Secondary | ICD-10-CM | POA: Diagnosis not present

## 2014-07-29 DIAGNOSIS — M65872 Other synovitis and tenosynovitis, left ankle and foot: Secondary | ICD-10-CM | POA: Diagnosis not present

## 2014-07-29 LAB — COMPREHENSIVE METABOLIC PANEL
ALK PHOS: 71 U/L (ref 39–117)
ALT: 14 U/L (ref 0–35)
AST: 17 U/L (ref 0–37)
Albumin: 4.3 g/dL (ref 3.5–5.2)
BUN: 17 mg/dL (ref 6–23)
CO2: 26 mEq/L (ref 19–32)
Calcium: 9.1 mg/dL (ref 8.4–10.5)
Chloride: 102 mEq/L (ref 96–112)
Creatinine, Ser: 0.7 mg/dL (ref 0.4–1.2)
GFR: 84.37 mL/min (ref >60.00)
Glucose, Bld: 87 mg/dL (ref 70–99)
Potassium: 3.6 mEq/L (ref 3.5–5.1)
SODIUM: 139 meq/L (ref 135–145)
TOTAL PROTEIN: 7.2 g/dL (ref 6.0–8.3)
Total Bilirubin: 0.9 mg/dL (ref 0.2–1.2)

## 2014-07-29 LAB — LIPID PANEL
CHOL/HDL RATIO: 4
Cholesterol: 243 mg/dL — ABNORMAL HIGH (ref 0–200)
HDL: 65.3 mg/dL (ref >39.00)
NonHDL: 177.7
Triglycerides: 217 mg/dL — ABNORMAL HIGH (ref 0.0–149.0)
VLDL: 43.4 mg/dL — AB (ref 0.0–40.0)

## 2014-07-31 LAB — LDL CHOLESTEROL, DIRECT: LDL DIRECT: 139.1 mg/dL

## 2014-08-03 ENCOUNTER — Encounter: Payer: Self-pay | Admitting: Family Medicine

## 2014-08-03 NOTE — Telephone Encounter (Signed)
See My chart message

## 2014-08-23 DIAGNOSIS — M216X1 Other acquired deformities of right foot: Secondary | ICD-10-CM | POA: Diagnosis not present

## 2014-08-23 DIAGNOSIS — M65871 Other synovitis and tenosynovitis, right ankle and foot: Secondary | ICD-10-CM | POA: Diagnosis not present

## 2014-09-03 ENCOUNTER — Other Ambulatory Visit: Payer: Self-pay | Admitting: *Deleted

## 2014-09-03 ENCOUNTER — Encounter: Payer: Self-pay | Admitting: Family Medicine

## 2014-09-03 MED ORDER — CIPROFLOXACIN HCL 500 MG PO TABS: 500.0000 mg | ORAL_TABLET | Freq: Two times a day (BID) | ORAL | Status: DC

## 2014-09-03 NOTE — Telephone Encounter (Signed)
Pt called about abx not at [facility]; advised pt rx was sent to mail order pharmacy; pt will contact mail order pharmacy to d/c that rx and I advised pt would send to [facility] now. Pt voiced understanding.

## 2014-09-07 ENCOUNTER — Encounter: Payer: Self-pay | Admitting: Family Medicine

## 2014-09-14 DIAGNOSIS — M216X1 Other acquired deformities of right foot: Secondary | ICD-10-CM | POA: Diagnosis not present

## 2014-09-14 DIAGNOSIS — M65871 Other synovitis and tenosynovitis, right ankle and foot: Secondary | ICD-10-CM | POA: Diagnosis not present

## 2014-10-06 DIAGNOSIS — Z96653 Presence of artificial knee joint, bilateral: Secondary | ICD-10-CM | POA: Diagnosis not present

## 2014-12-01 ENCOUNTER — Encounter: Payer: Self-pay | Admitting: Family Medicine

## 2014-12-05 ENCOUNTER — Encounter: Payer: Self-pay | Admitting: Family Medicine

## 2014-12-06 ENCOUNTER — Encounter: Payer: Self-pay | Admitting: Family Medicine

## 2014-12-13 ENCOUNTER — Other Ambulatory Visit: Payer: Self-pay | Admitting: Family Medicine

## 2014-12-13 ENCOUNTER — Other Ambulatory Visit (INDEPENDENT_AMBULATORY_CARE_PROVIDER_SITE_OTHER): Payer: Medicare Other

## 2014-12-13 DIAGNOSIS — E785 Hyperlipidemia, unspecified: Secondary | ICD-10-CM

## 2014-12-13 DIAGNOSIS — I10 Essential (primary) hypertension: Secondary | ICD-10-CM

## 2014-12-13 LAB — BASIC METABOLIC PANEL
BUN: 13 mg/dL (ref 6–23)
CALCIUM: 9.3 mg/dL (ref 8.4–10.5)
CHLORIDE: 105 meq/L (ref 96–112)
CO2: 29 meq/L (ref 19–32)
CREATININE: 0.74 mg/dL (ref 0.40–1.20)
GFR: 82.96 mL/min (ref >60.00)
Glucose, Bld: 89 mg/dL (ref 70–99)
Potassium: 4.2 mEq/L (ref 3.5–5.1)
Sodium: 139 mEq/L (ref 135–145)

## 2014-12-13 LAB — LIPID PANEL
Cholesterol: 209 mg/dL — ABNORMAL HIGH (ref 0–200)
HDL: 74.6 mg/dL (ref >39.00)
LDL CALC: 108 mg/dL — AB (ref 0–99)
NonHDL: 134.4
TRIGLYCERIDES: 132 mg/dL (ref 0.0–149.0)
Total CHOL/HDL Ratio: 3
VLDL: 26.4 mg/dL (ref 0.0–40.0)

## 2014-12-20 ENCOUNTER — Encounter: Payer: Self-pay | Admitting: Family Medicine

## 2014-12-20 ENCOUNTER — Ambulatory Visit (INDEPENDENT_AMBULATORY_CARE_PROVIDER_SITE_OTHER): Payer: Medicare Other | Admitting: Family Medicine

## 2014-12-20 VITALS — BP 118/74 | HR 60 | Temp 97.4°F | Ht 64.75 in | Wt 188.2 lb

## 2014-12-20 DIAGNOSIS — M1990 Unspecified osteoarthritis, unspecified site: Secondary | ICD-10-CM

## 2014-12-20 DIAGNOSIS — J302 Other seasonal allergic rhinitis: Secondary | ICD-10-CM | POA: Diagnosis not present

## 2014-12-20 DIAGNOSIS — M8580 Other specified disorders of bone density and structure, unspecified site: Secondary | ICD-10-CM

## 2014-12-20 DIAGNOSIS — E785 Hyperlipidemia, unspecified: Secondary | ICD-10-CM | POA: Diagnosis not present

## 2014-12-20 DIAGNOSIS — I10 Essential (primary) hypertension: Secondary | ICD-10-CM

## 2014-12-20 DIAGNOSIS — Z0000 Encounter for general adult medical examination without abnormal findings: Secondary | ICD-10-CM | POA: Diagnosis not present

## 2014-12-20 DIAGNOSIS — Z7189 Other specified counseling: Secondary | ICD-10-CM | POA: Insufficient documentation

## 2014-12-20 MED ORDER — OLOPATADINE HCL 0.1 % OP SOLN: 1.0000 [drp] | Freq: Two times a day (BID) | OPHTHALMIC | Status: DC

## 2014-12-20 MED ORDER — AMLODIPINE BESYLATE 5 MG PO TABS: 5.0000 mg | ORAL_TABLET | Freq: Every day | ORAL | Status: DC

## 2014-12-20 MED ORDER — PRAVASTATIN SODIUM 40 MG PO TABS: 40.0000 mg | ORAL_TABLET | Freq: Every day | ORAL | Status: DC

## 2014-12-20 NOTE — Assessment & Plan Note (Addendum)
Advanced directives - husband has DNR. Pt has not set up yet. Would want husband then daughter as HCPOA. Packet provided today.

## 2014-12-20 NOTE — Patient Instructions (Addendum)
Try patanol eye drops for allergies - sent to pharmacy. Advanced directive packet provided today. Good to see you today. Return as needed or in 1 year for next medicare wellness visit.

## 2014-12-20 NOTE — Progress Notes (Signed)
BP 118/74 mmHg  Pulse 60  Temp(Src) 97.4 F (36.3 C) (Oral)  Ht 5' 4.75" (1.645 m)  Wt 188 lb 4 oz (85.39 kg)  BMI 31.56 kg/m2   CC: medicare wellness visit  Subjective:    Patient ID: Victoria Morrison, female    DOB: May 21, 1946, 69 y.o.   MRN: [id number]  HPI: Victoria Morrison is a 69 y.o. female presenting on 12/20/2014 for Annual Exam   Normal hearing screen today. Vision with eye doctor.  Denies depression, sadness, anhedonia.  No falls in last year.   Preventative: Last mammogram 11/2011 Birads 2. Strong fmhx breast cancer.  G4P4  COLONOSCOPY Date: 12/2013 3 TAs, diverticulosis, rpt 5 yrs Fuller Plan) Well woman - Always normal paps in past. would like to continue Q3 yrs Mammogram normal 12/2013. Gets yearly due to fmhx. DEXA Date: 12/2012 T score -1.3 femur, -1.9 radius Tetanus - 11/2012 Pneumovax - 05/07/2012, prevnar 11/2013 Flu - 05/2014 Shingles shot - 11/2012 Advanced directives - husband has DNR. Pt has not set up yet. Would want husband then daughter as HCPOA. Packet provided today. Seat belt use discussed Sunscreen use discussed   Lives with husband, [name].  Occupation: retired, was Optician, dispensing  Edu: college  Activity: walks regularly, gym  Diet: good water, vegetables daily   Relevant past medical, surgical, family and social history reviewed and updated as indicated. Interim medical history since our last visit reviewed. Allergies and medications reviewed and updated. No current outpatient prescriptions on file prior to visit.   No current facility-administered medications on file prior to visit.    Review of Systems Per HPI unless specifically indicated above     Objective:    BP 118/74 mmHg  Pulse 60  Temp(Src) 97.4 F (36.3 C) (Oral)  Ht 5' 4.75" (1.645 m)  Wt 188 lb 4 oz (85.39 kg)  BMI 31.56 kg/m2  Wt Readings from Last 3 Encounters:  12/20/14 188 lb 4 oz (85.39 kg)  04/09/14 185 lb 8 oz (84.142 kg)  01/01/14 187 lb (84.823 kg)    Physical  Exam  Constitutional: She is oriented to person, place, and time. She appears well-developed and well-nourished. No distress.  HENT:  Head: Normocephalic and atraumatic.  Right Ear: Hearing, tympanic membrane, external ear and ear canal normal.  Left Ear: Hearing, tympanic membrane, external ear and ear canal normal.  Nose: Nose normal.  Mouth/Throat: Uvula is midline, oropharynx is clear and moist and mucous membranes are normal. No oropharyngeal exudate, posterior oropharyngeal edema or posterior oropharyngeal erythema.  Eyes: Conjunctivae and EOM are normal. Pupils are equal, round, and reactive to light. No scleral icterus.  Neck: Normal range of motion. Neck supple. Carotid bruit is not present. No thyromegaly present.  Cardiovascular: Normal rate, regular rhythm, normal heart sounds and intact distal pulses.   No murmur heard. Pulses:      Radial pulses are 2+ on the right side, and 2+ on the left side.  Pulmonary/Chest: Effort normal and breath sounds normal. No respiratory distress. She has no wheezes. She has no rales. Right breast exhibits no inverted nipple, no mass, no nipple discharge, no skin change and no tenderness. Left breast exhibits no inverted nipple, no mass, no nipple discharge, no skin change and no tenderness.  Abdominal: Soft. Bowel sounds are normal. She exhibits no distension and no mass. There is no tenderness. There is no rebound and no guarding.  Musculoskeletal: Normal range of motion. She exhibits no edema.  Lymphadenopathy:  Head (right side): No submental, no submandibular, no tonsillar, no preauricular and no posterior auricular adenopathy present.       Head (left side): No submental, no submandibular, no tonsillar, no preauricular and no posterior auricular adenopathy present.    She has no cervical adenopathy.    She has no axillary adenopathy.       Right axillary: No lateral adenopathy present.       Left axillary: No lateral adenopathy present.       Right: No supraclavicular adenopathy present.       Left: No supraclavicular adenopathy present.  Neurological: She is alert and oriented to person, place, and time.  CN grossly intact, station and gait intact Recall 3/3  Calculation 5/5 serials7s  Skin: Skin is warm and dry. No rash noted.  Psychiatric: She has a normal mood and affect. Her behavior is normal. Judgment and thought content normal.  Nursing note and vitals reviewed.  Results for orders placed or performed in visit on 12/13/14  Lipid panel  Result Value Ref Range   Cholesterol 209 (H) 0 - 200 mg/dL   Triglycerides 132.0 0.0 - 149.0 mg/dL   HDL 74.60 >39.00 mg/dL   VLDL 26.4 0.0 - 40.0 mg/dL   LDL Cholesterol 108 (H) 0 - 99 mg/dL   Total CHOL/HDL Ratio 3    NonHDL 161.09   Basic metabolic panel  Result Value Ref Range   Sodium 139 135 - 145 mEq/L   Potassium 4.2 3.5 - 5.1 mEq/L   Chloride 105 96 - 112 mEq/L   CO2 29 19 - 32 mEq/L   Glucose, Bld 89 70 - 99 mg/dL   BUN 13 6 - 23 mg/dL   Creatinine, Ser 0.74 0.40 - 1.20 mg/dL   Calcium 9.3 8.4 - 10.5 mg/dL   GFR 82.96 >60.00 mL/min      Assessment & Plan:   Problem List Items Addressed This Visit    Seasonal allergic rhinitis    Requests patanol for allergic conjunctivitis.      Osteopenia    Reviewed recommended calcium and vit D daily intake.      Medicare annual wellness visit, subsequent - Primary    I have personally reviewed the Medicare Annual Wellness questionnaire and have noted 1. The patient's medical and social history 2. Their use of alcohol, tobacco or illicit drugs 3. Their current medications and supplements 4. The patient's functional ability including ADL's, fall risks, home safety risks and hearing or visual impairment. 5. Diet and physical activity 6. Evidence for depression or mood disorders The patients weight, height, BMI have been recorded in the chart.  Hearing and vision has been addressed. I have made referrals, counseling  and provided education to the patient based review of the above and I have provided the pt with a written personalized care plan for preventive services. Provider list updated - see scanned questionairre. Reviewed preventative protocols and updated unless pt declined.      HTN (hypertension)    Chronic, stable. Continue amlodipine 5mg  daily regimen.      Relevant Medications   amLODipine (NORVASC) 5 MG tablet   pravastatin (PRAVACHOL) 40 MG tablet   HLD (hyperlipidemia)    Chronic, stable. Continue pravastatin low dose.      Relevant Medications   amLODipine (NORVASC) 5 MG tablet   pravastatin (PRAVACHOL) 40 MG tablet   Arthritis    Anticipate OA - DoTerra vitamins helpful.      Advanced care planning/counseling discussion  Advanced directives - husband has DNR. Pt has not set up yet. Would want husband then daughter as HCPOA. Packet provided today.          Follow up plan: Return in about 1 year (around 12/20/2015), or as needed, for medicare wellness.

## 2014-12-20 NOTE — Assessment & Plan Note (Signed)
Reviewed recommended calcium and vit D daily intake.

## 2014-12-20 NOTE — Assessment & Plan Note (Signed)
Chronic, stable. Continue amlodipine 5mg  daily regimen.

## 2014-12-20 NOTE — Assessment & Plan Note (Signed)

## 2014-12-20 NOTE — Progress Notes (Signed)
Pre visit review using our clinic review tool, if applicable. No additional management support is needed unless otherwise documented below in the visit note. 

## 2014-12-20 NOTE — Assessment & Plan Note (Signed)
Requests patanol for allergic conjunctivitis.

## 2014-12-20 NOTE — Addendum Note (Signed)
Addended by: Ria Bush on: 12/20/2014 01:31 PM   Modules accepted: Orders, Medications, SmartSet

## 2014-12-20 NOTE — Assessment & Plan Note (Signed)
Chronic, stable. Continue pravastatin low dose.

## 2014-12-20 NOTE — Assessment & Plan Note (Signed)
Anticipate OA - DoTerra vitamins helpful.

## 2014-12-21 ENCOUNTER — Other Ambulatory Visit: Payer: Self-pay

## 2014-12-21 ENCOUNTER — Telehealth: Payer: Self-pay | Admitting: *Deleted

## 2014-12-21 DIAGNOSIS — Z1231 Encounter for screening mammogram for malignant neoplasm of breast: Secondary | ICD-10-CM

## 2014-12-21 NOTE — Telephone Encounter (Signed)
Call from [place] at [facility].  Olapatadine 0.1% ophthalmic solution was prescribed 55mL with 11 refills.  This is only enough for 22 days.  Mail order requires at least a 30 day supply, but they would like to fill a 90 day supply if possible.  She is requesting a call back with new order if needed.  Please advise.

## 2014-12-21 NOTE — Telephone Encounter (Signed)
plz call back - I called yesterday [facility] and spoke with an assistant there to cancel this order. I sent it in locally. plz cancel mail order Rx.

## 2014-12-21 NOTE — Telephone Encounter (Signed)
Sarah at [facility] notified and cancelled order.

## 2015-01-12 ENCOUNTER — Ambulatory Visit
Admission: RE | Admit: 2015-01-12 | Discharge: 2015-01-12 | Disposition: A | Discharge status: Home / Self Care | Payer: Medicare Other | Source: Ambulatory Visit

## 2015-01-12 DIAGNOSIS — Z1231 Encounter for screening mammogram for malignant neoplasm of breast: Secondary | ICD-10-CM

## 2015-01-13 ENCOUNTER — Encounter: Payer: Self-pay | Admitting: *Deleted

## 2015-01-13 LAB — HM MAMMOGRAPHY: HM MAMMO: NORMAL

## 2015-04-18 DIAGNOSIS — H269 Unspecified cataract: Secondary | ICD-10-CM | POA: Diagnosis not present

## 2015-05-10 ENCOUNTER — Telehealth: Payer: Self-pay | Admitting: *Deleted

## 2015-05-10 DIAGNOSIS — Z1159 Encounter for screening for other viral diseases: Secondary | ICD-10-CM

## 2015-05-10 NOTE — Telephone Encounter (Signed)
Agree. Thanks

## 2015-05-10 NOTE — Telephone Encounter (Signed)
Patient was asking about Hep C screen just like her husband. I went ahead and ordered lab and scheduled flu clinic and lab appt.

## 2015-05-11 ENCOUNTER — Ambulatory Visit (INDEPENDENT_AMBULATORY_CARE_PROVIDER_SITE_OTHER): Payer: Medicare Other

## 2015-05-11 ENCOUNTER — Other Ambulatory Visit: Payer: Self-pay | Admitting: Family Medicine

## 2015-05-11 ENCOUNTER — Other Ambulatory Visit (INDEPENDENT_AMBULATORY_CARE_PROVIDER_SITE_OTHER): Payer: Medicare Other

## 2015-05-11 DIAGNOSIS — Z23 Encounter for immunization: Secondary | ICD-10-CM

## 2015-05-11 DIAGNOSIS — Z1159 Encounter for screening for other viral diseases: Secondary | ICD-10-CM

## 2015-05-12 LAB — HEPATITIS C ANTIBODY: HCV AB: NEGATIVE

## 2015-05-26 DIAGNOSIS — Z85828 Personal history of other malignant neoplasm of skin: Secondary | ICD-10-CM | POA: Diagnosis not present

## 2015-05-26 DIAGNOSIS — L578 Other skin changes due to chronic exposure to nonionizing radiation: Secondary | ICD-10-CM | POA: Diagnosis not present

## 2015-05-26 DIAGNOSIS — L812 Freckles: Secondary | ICD-10-CM | POA: Diagnosis not present

## 2015-05-26 DIAGNOSIS — L918 Other hypertrophic disorders of the skin: Secondary | ICD-10-CM | POA: Diagnosis not present

## 2015-05-26 DIAGNOSIS — L821 Other seborrheic keratosis: Secondary | ICD-10-CM | POA: Diagnosis not present

## 2015-05-26 DIAGNOSIS — D229 Melanocytic nevi, unspecified: Secondary | ICD-10-CM | POA: Diagnosis not present

## 2015-05-26 DIAGNOSIS — Z1283 Encounter for screening for malignant neoplasm of skin: Secondary | ICD-10-CM | POA: Diagnosis not present

## 2015-05-26 DIAGNOSIS — D1800 Hemangioma unspecified site: Secondary | ICD-10-CM | POA: Diagnosis not present

## 2015-12-28 ENCOUNTER — Telehealth: Payer: Self-pay | Admitting: Family Medicine

## 2015-12-28 NOTE — Telephone Encounter (Signed)
Pt returned your call.  

## 2015-12-30 ENCOUNTER — Other Ambulatory Visit: Payer: Self-pay

## 2015-12-30 DIAGNOSIS — Z1231 Encounter for screening mammogram for malignant neoplasm of breast: Secondary | ICD-10-CM

## 2016-01-01 ENCOUNTER — Other Ambulatory Visit: Payer: Self-pay | Admitting: Family Medicine

## 2016-01-01 DIAGNOSIS — E785 Hyperlipidemia, unspecified: Secondary | ICD-10-CM

## 2016-01-01 DIAGNOSIS — I10 Essential (primary) hypertension: Secondary | ICD-10-CM

## 2016-01-03 ENCOUNTER — Ambulatory Visit (INDEPENDENT_AMBULATORY_CARE_PROVIDER_SITE_OTHER): Payer: Medicare Other

## 2016-01-03 ENCOUNTER — Other Ambulatory Visit (INDEPENDENT_AMBULATORY_CARE_PROVIDER_SITE_OTHER): Payer: Medicare Other

## 2016-01-03 VITALS — BP 122/78 | HR 53 | Temp 98.3°F | Ht 63.0 in | Wt 190.5 lb

## 2016-01-03 DIAGNOSIS — I10 Essential (primary) hypertension: Secondary | ICD-10-CM

## 2016-01-03 DIAGNOSIS — Z0000 Encounter for general adult medical examination without abnormal findings: Secondary | ICD-10-CM

## 2016-01-03 DIAGNOSIS — E785 Hyperlipidemia, unspecified: Secondary | ICD-10-CM

## 2016-01-03 LAB — BASIC METABOLIC PANEL
BUN: 14 mg/dL (ref 6–23)
CHLORIDE: 102 meq/L (ref 96–112)
CO2: 28 mEq/L (ref 19–32)
CREATININE: 0.7 mg/dL (ref 0.40–1.20)
Calcium: 9.6 mg/dL (ref 8.4–10.5)
GFR: 88.18 mL/min (ref >60.00)
Glucose, Bld: 91 mg/dL (ref 70–99)
Potassium: 4 mEq/L (ref 3.5–5.1)
Sodium: 140 mEq/L (ref 135–145)

## 2016-01-03 LAB — LIPID PANEL
CHOLESTEROL: 226 mg/dL — AB (ref 0–200)
HDL: 81.5 mg/dL (ref >39.00)
LDL Cholesterol: 123 mg/dL — ABNORMAL HIGH (ref 0–99)
NONHDL: 144.65
TRIGLYCERIDES: 106 mg/dL (ref 0.0–149.0)
Total CHOL/HDL Ratio: 3
VLDL: 21.2 mg/dL (ref 0.0–40.0)

## 2016-01-03 NOTE — Progress Notes (Signed)
PCP notes:  Health maintenance: Pt will discuss cervical cancer screening with PCP at next office visit. Cologuard ordered. Order [id number] has been submitted.

## 2016-01-03 NOTE — Progress Notes (Signed)
Subjective:   Victoria Morrison is a 70 y.o. female who presents for Medicare Annual (Subsequent) preventive examination.  Review of Systems:  N/A Cardiac Risk Factors include: advanced age (>43men, >19 women);dyslipidemia;hypertension;obesity (BMI >30kg/m2)     Objective:     Vitals: BP 122/78 mmHg  Pulse 53  Temp(Src) 98.3 F (36.8 C) (Oral)  Ht 5\' 3"  (1.6 m)  Wt 190 lb 8 oz (86.41 kg)  BMI 33.75 kg/m2  SpO2 96%  Body mass index is 33.75 kg/(m^2).   Tobacco History  Smoking status  . Former Smoker  . Quit date: 08/27/1968  Smokeless tobacco  . Never Used     Counseling given: No   Past Medical History  Diagnosis Date  . Seasonal allergic rhinitis   . HTN (hypertension)   . HLD (hyperlipidemia)   . Environmental allergies     dust; pet dander; pollen  . History of MRSA infection 2005    abdomen  . Asymptomatic bacteriuria   . History of pneumonia 1980s  . History of asthma 1980s  . Hydradenitis 2005  . Diverticulosis   . Postmenopausal 2000  . History of rheumatoid arthritis 2009    in remission, off MTX since 2005, seronegative  . Osteoarthritis 2009    bilateral hands/knees s/p synvisc injections x3 ([facility])  . Osteopenia 12/2012    dexa with T score -1.9  . Allergy     SEASONAL  . Cancer ([place])     SKIN  . Osteoporosis    Past Surgical History  Procedure Laterality Date  . Appendectomy  1954  . Meniscus repair  2009    Left  . Tonsillectomy      childhood  . Dexa  12/2012    T score -1.3 femur, -1.9 radius  . 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 Colonoscopy  12/2013    3 TAs, diverticulosis, rpt 5 yrs Fuller Plan)   Family History  Problem Relation Age of Onset  . Cancer Mother 1    breast  . Cancer Maternal Grandmother 31    breast (or unsure)  . Cancer Brother 59    lung (smoker)  . Alcoholism Father   . CAD Father 46    MI  . Stroke Neg Hx   . Diabetes Neg Hx    History  Sexual Activity  .  Sexual Activity: No    Outpatient Encounter Prescriptions as of 01/03/2016  Medication Sig  . amLODipine (NORVASC) 5 MG tablet Take 1 tablet (5 mg total) by mouth daily.  . NON FORMULARY DoTerra Lifelong Vitality Supplement  . olopatadine (PATANOL) 0.1 % ophthalmic solution Place 1 drop into both eyes 2 (two) times daily. (Patient taking differently: Place 1 drop into both eyes as needed. )  . pravastatin (PRAVACHOL) 40 MG tablet Take 1 tablet (40 mg total) by mouth daily.   No facility-administered encounter medications on file as of 01/03/2016.    Activities of Daily Living In your present state of health, do you have any difficulty performing the following activities: 01/03/2016  Hearing? N  Vision? N  Difficulty concentrating or making decisions? N  Walking or climbing stairs? N  Dressing or bathing? N  Doing errands, shopping? N  Preparing Food and eating ? N  Using the Toilet? N  In the past six months, have you accidently leaked urine? N  Do you have problems with loss of bowel control? N  Managing your Medications? N  Managing your  Finances? N  Housekeeping or managing your Housekeeping? N    Patient Care Team: Ria Bush, MD as PCP - General (Family Medicine) Leanor Kail, MD as Consulting Physician (Orthopedic Surgery) Albertine Patricia, DPM as Attending Physician (Podiatry)    Assessment:     Hearing Screening   125Hz  250Hz  500Hz  1000Hz  2000Hz  4000Hz  8000Hz   Right ear:   40 40 40 40   Left ear:   40 40 40 40   Vision Screening Comments: Last eye exam in March 2017   Exercise Activities and Dietary recommendations Current Exercise Habits: Home exercise routine, Type of exercise: walking, Time (Minutes): 30, Frequency (Times/Week): 5, Weekly Exercise (Minutes/Week): 150, Intensity: Mild  Goals    . Weight < 200 lb (90.719 kg)     Starting 01/03/2016, I will continue to drink more smoothies, reduce intake of soda and desserts, and stop eating between meals in  an effort to reach weight target of 170 lbs.       Fall Risk Fall Risk  01/03/2016 12/20/2014 12/09/2013 12/04/2012  Falls in the past year? No No No No   Depression Screen PHQ 2/9 Scores 01/03/2016 12/20/2014 12/09/2013 12/04/2012  PHQ - 2 Score 0 0 0 1     Cognitive Testing MMSE - Mini Mental State Exam 01/03/2016  Orientation to time 5  Orientation to Place 5  Registration 3  Attention/ Calculation 0  Recall 3  Language- name 2 objects 0  Language- repeat 1  Language- follow 3 step command 3  Language- read & follow direction 0  Write a sentence 0  Copy design 0  Total score 20   PLEASE NOTE: A Mini-Cog screen was completed. Maximum score is 20. A value of 0 denotes this part of Folstein MMSE was not completed or the patient failed this part of the Mini-Cog screening.   Mini-Cog Screening Orientation to Time - Max 5 pts Orientation to Place - Max 5 pts Registration - Max 3 pts Recall - Max 3 pts Language Repeat - Max 1 pts Language Follow 3 Step Command - Max 3 pts  Immunization History  Administered Date(s) Administered  . Influenza,inj,Quad PF,36+ Mos 05/15/2013, 06/07/2014, 05/11/2015  . Pneumococcal Conjugate-13 12/09/2013  . Pneumococcal Polysaccharide-23 06/08/2004, 05/07/2012  . Td 12/04/2012  . Zoster 12/04/2012   Screening Tests Health Maintenance  Topic Date Due  . PAP SMEAR  01/24/2016 (Originally 12/10/2014)  . Fecal DNA (Cologuard)  05/27/2016 (Originally 01/01/1997)  . COLON CANCER SCREENING ANNUAL FOBT  01/02/2017 (Originally 01/02/2015)  . INFLUENZA VACCINE  03/27/2016  . MAMMOGRAM  01/12/2017  . COLONOSCOPY  01/02/2019  . TETANUS/TDAP  12/05/2022  . DTaP/Tdap/Td  Completed  . DEXA SCAN  Completed  . ZOSTAVAX  Completed  . Hepatitis C Screening  Completed  . PNA vac Low Risk Adult  Completed      Plan:      I have personally reviewed and addressed the Medicare Annual Wellness questionnaire and have noted the following in the patient's chart:   A. Medical and social history B. Use of alcohol, tobacco or illicit drugs  C. Current medications and supplements D. Functional ability and status E.  Nutritional status F.  Physical activity G. Advance directives H. List of other physicians I.  Hospitalizations, surgeries, and ER visits in previous 12 months J.  [place] to include hearing, vision, cognitive, depression L. Referrals and appointments - none  In addition, I have reviewed and discussed with patient certain preventive protocols, quality metrics, and  best practice recommendations. A written personalized care plan for preventive services as well as general preventive health recommendations were provided to patient.  See attached scanned questionnaire for additional information.   Signed,   Lindell Noe, MHA, BS, LPN Health Advisor [phone]

## 2016-01-03 NOTE — Patient Instructions (Signed)
Victoria Morrison , Thank you for taking time to come for your Medicare Wellness Visit. I appreciate your ongoing commitment to your health goals. Please review the following plan we discussed and let me know if I can assist you in the future.   These are the goals we discussed: Goals    . Weight < 200 lb (90.719 kg)     Starting 01/03/2016, I will continue to drink more smoothies, reduce intake of soda and desserts, and stop eating between meals in an effort to reach weight target of 170 lbs.        This is a list of the screening recommended for you and due dates:  Health Maintenance  Topic Date Due  . Pap Smear  01/24/2016*  . Cologuard (Stool DNA test)  05/27/2016*  . Stool Blood Test  01/02/2017*  . Flu Shot  03/27/2016  . Mammogram  01/12/2017  . Colon Cancer Screening  01/02/2019  . Tetanus Vaccine  12/05/2022  . DTaP/Tdap/Td vaccine  Completed  . DEXA scan (bone density measurement)  Completed  . Shingles Vaccine  Completed  .  Hepatitis C: One time screening is recommended by Center for Disease Control  (CDC) for  adults born from [birth year] through 1964.   Completed  . Pneumonia vaccines  Completed  *Topic was postponed. The date shown is not the original due date.    Preventive Care for Adults  A healthy lifestyle and preventive care can promote health and wellness. Preventive health guidelines for adults include the following key practices.  . A routine yearly physical is a good way to check with your health care provider about your health and preventive screening. It is a chance to share any concerns and updates on your health and to receive a thorough exam.  . Visit your dentist for a routine exam and preventive care every 6 months. Brush your teeth twice a day and floss once a day. Good oral hygiene prevents tooth decay and gum disease.  . The frequency of eye exams is based on your age, health, family medical history, use  of contact lenses, and other factors. Follow your  health care provider's ecommendations for frequency of eye exams.  . Eat a healthy diet. Foods like vegetables, fruits, whole grains, low-fat dairy products, and lean protein foods contain the nutrients you need without too many calories. Decrease your intake of foods high in solid fats, added sugars, and salt. Eat the right amount of calories for you. Get information about a proper diet from your health care provider, if necessary.  . Regular physical exercise is one of the most important things you can do for your health. Most adults should get at least 150 minutes of moderate-intensity exercise (any activity that increases your heart rate and causes you to sweat) each week. In addition, most adults need muscle-strengthening exercises on 2 or more days a week.  Silver Sneakers may be a benefit available to you. To determine eligibility, you may visit the website: www.silversneakers.com or contact program at (916)272-3659 Mon-Fri between 8AM-8PM.   . Maintain a healthy weight. The body mass index (BMI) is a screening tool to identify possible weight problems. It provides an estimate of body fat based on height and weight. Your health care provider can find your BMI and can help you achieve or maintain a healthy weight.   For adults 20 years and older: ? A BMI below 18.5 is considered underweight. ? A BMI of 18.5 to 24.9 is normal. ?  A BMI of 25 to 29.9 is considered overweight. ? A BMI of 30 and above is considered obese.   . Maintain normal blood lipids and cholesterol levels by exercising and minimizing your intake of saturated fat. Eat a balanced diet with plenty of fruit and vegetables. Blood tests for lipids and cholesterol should begin at age 74 and be repeated every 5 years. If your lipid or cholesterol levels are high, you are over 50, or you are at high risk for heart disease, you may need your cholesterol levels checked more frequently. Ongoing high lipid and cholesterol levels should be  treated with medicines if diet and exercise are not working.  . If you smoke, find out from your health care provider how to quit. If you do not use tobacco, please do not start.  . If you choose to drink alcohol, please do not consume more than 2 drinks per day. One drink is considered to be 12 ounces (355 mL) of beer, 5 ounces (148 mL) of wine, or 1.5 ounces (44 mL) of liquor.  . If you are 74-69 years old, ask your health care provider if you should take aspirin to prevent strokes.  . Use sunscreen. Apply sunscreen liberally and repeatedly throughout the day. You should seek shade when your shadow is shorter than you. Protect yourself by wearing long sleeves, pants, a wide-brimmed hat, and sunglasses year round, whenever you are outdoors.  . Once a month, do a whole body skin exam, using a mirror to look at the skin on your back. Tell your health care provider of new moles, moles that have irregular borders, moles that are larger than a pencil eraser, or moles that have changed in shape or color.

## 2016-01-03 NOTE — Progress Notes (Signed)
Pre visit review using our clinic review tool, if applicable. No additional management support is needed unless otherwise documented below in the visit note. 

## 2016-01-04 NOTE — Progress Notes (Signed)
I reviewed health advisor's note, was available for consultation, and agree with documentation and plan.  

## 2016-01-05 ENCOUNTER — Ambulatory Visit: Payer: Medicare Other

## 2016-01-05 ENCOUNTER — Other Ambulatory Visit: Payer: Medicare Other

## 2016-01-09 ENCOUNTER — Ambulatory Visit (INDEPENDENT_AMBULATORY_CARE_PROVIDER_SITE_OTHER): Payer: Medicare Other | Admitting: Family Medicine

## 2016-01-09 ENCOUNTER — Encounter: Payer: Self-pay | Admitting: Family Medicine

## 2016-01-09 VITALS — BP 124/68 | HR 68 | Temp 97.9°F | Wt 192.5 lb

## 2016-01-09 DIAGNOSIS — Z1211 Encounter for screening for malignant neoplasm of colon: Secondary | ICD-10-CM | POA: Diagnosis not present

## 2016-01-09 DIAGNOSIS — E785 Hyperlipidemia, unspecified: Secondary | ICD-10-CM

## 2016-01-09 DIAGNOSIS — Z1212 Encounter for screening for malignant neoplasm of rectum: Secondary | ICD-10-CM | POA: Diagnosis not present

## 2016-01-09 DIAGNOSIS — M8580 Other specified disorders of bone density and structure, unspecified site: Secondary | ICD-10-CM | POA: Diagnosis not present

## 2016-01-09 DIAGNOSIS — I10 Essential (primary) hypertension: Secondary | ICD-10-CM | POA: Diagnosis not present

## 2016-01-09 LAB — COLOGUARD: Cologuard: POSITIVE

## 2016-01-09 MED ORDER — PRAVASTATIN SODIUM 40 MG PO TABS: 40.0000 mg | ORAL_TABLET | Freq: Every day | ORAL | Status: DC

## 2016-01-09 MED ORDER — AMLODIPINE BESYLATE 5 MG PO TABS: 5.0000 mg | ORAL_TABLET | Freq: Every day | ORAL | Status: DC

## 2016-01-09 NOTE — Progress Notes (Signed)
BP 124/68 mmHg  Pulse 68  Temp(Src) 97.9 F (36.6 C) (Oral)  Wt 192 lb 8 oz (87.317 kg)   CC: f/u visit  Subjective:    Patient ID: Victoria Morrison, female    DOB: 28-Jun-1946, 70 y.o.   MRN: [id number]  HPI: Victoria Morrison is a 70 y.o. female presenting on 01/09/2016 for Annual Exam   Seen last week by Katha Cabal for medicare wellness visit - note reviewed. Cologuard was ordered - and completed today. Colonoscopy 12/213 with rpt due 5 yrs.   Well woman - Always normal paps in past. would like to continue Q3 yrs - next due 2018.  Mammogram - WNL 12/2014, rpt due this month (scheduled 01/16/2016).   HTN - Compliant with current antihypertensive regimen of amlodipine 5mg  daily. Does not check blood pressures at home.  No low blood pressure symptoms of dizziness/syncope. Denies HA, vision changes, CP/tightness, SOB, leg swelling.    HLD - compliant with pravastatin without myalgias.   Osteopenia - T score -1.9. Reviewed calcium and vit D daily intake.   Relevant past medical, surgical, family and social history reviewed and updated as indicated. Interim medical history since our last visit reviewed. Allergies and medications reviewed and updated. Current Outpatient Prescriptions on File Prior to Visit  Medication Sig  . NON FORMULARY DoTerra Lifelong Vitality Supplement  . olopatadine (PATANOL) 0.1 % ophthalmic solution Place 1 drop into both eyes 2 (two) times daily. (Patient taking differently: Place 1 drop into both eyes as needed. )   No current facility-administered medications on file prior to visit.    Review of Systems Per HPI unless specifically indicated in ROS section     Objective:    BP 124/68 mmHg  Pulse 68  Temp(Src) 97.9 F (36.6 C) (Oral)  Wt 192 lb 8 oz (87.317 kg)  Wt Readings from Last 3 Encounters:  01/09/16 192 lb 8 oz (87.317 kg)  01/03/16 190 lb 8 oz (86.41 kg)  12/20/14 188 lb 4 oz (85.39 kg)    Physical Exam  Constitutional: She appears well-developed  and well-nourished. No distress.  HENT:  Nose: Nose normal.  Mouth/Throat: Oropharynx is clear and moist.  Cardiovascular: Normal rate, regular rhythm, normal heart sounds and intact distal pulses.   No murmur heard. Pulmonary/Chest: Effort normal and breath sounds normal. No respiratory distress. She has no wheezes. She has no rales.  Musculoskeletal: She exhibits no edema.  Skin: Skin is warm and dry. No rash noted.  Psychiatric: She has a normal mood and affect.  Nursing note and vitals reviewed.  Results for orders placed or performed in visit on 01/03/16  Lipid panel  Result Value Ref Range   Cholesterol 226 (H) 0 - 200 mg/dL   Triglycerides 106.0 0.0 - 149.0 mg/dL   HDL 81.50 >39.00 mg/dL   VLDL 21.2 0.0 - 40.0 mg/dL   LDL Cholesterol 123 (H) 0 - 99 mg/dL   Total CHOL/HDL Ratio 3    NonHDL 123XX123   Basic metabolic panel  Result Value Ref Range   Sodium 140 135 - 145 mEq/L   Potassium 4.0 3.5 - 5.1 mEq/L   Chloride 102 96 - 112 mEq/L   CO2 28 19 - 32 mEq/L   Glucose, Bld 91 70 - 99 mg/dL   BUN 14 6 - 23 mg/dL   Creatinine, Ser 0.70 0.40 - 1.20 mg/dL   Calcium 9.6 8.4 - 10.5 mg/dL   GFR 88.18 >60.00 mL/min      Assessment &  Plan:   Problem List Items Addressed This Visit    HTN (hypertension) - Primary    Chronic, stable. Continue current regimen.      Relevant Medications   amLODipine (NORVASC) 5 MG tablet   pravastatin (PRAVACHOL) 40 MG tablet   HLD (hyperlipidemia)    Chronic, mildly deteriorated. Continue pravastatin 40mg  daily.      Relevant Medications   amLODipine (NORVASC) 5 MG tablet   pravastatin (PRAVACHOL) 40 MG tablet   Osteopenia    Reviewed goal calcium and vitamin D intake. Encouraged continued weight bearing exercises          Follow up plan: Return in about 1 year (around 01/08/2017), or as needed, for follow up visit.  Ria Bush, MD

## 2016-01-09 NOTE — Patient Instructions (Addendum)
Goal 1200mg  calcium daily - as much as you can from diet.  Goal 1000-2000 units vitamin D daily.  Well woman exam will be due next year.  Return as needed or in 1 year for next wellness visit/follow up.

## 2016-01-09 NOTE — Assessment & Plan Note (Signed)
Chronic, mildly deteriorated. Continue pravastatin 40mg  daily.

## 2016-01-09 NOTE — Assessment & Plan Note (Addendum)
Reviewed goal calcium and vitamin D intake. Encouraged continued weight bearing exercises

## 2016-01-09 NOTE — Assessment & Plan Note (Signed)
Chronic, stable. Continue current regimen. 

## 2016-01-09 NOTE — Progress Notes (Signed)
Pre visit review using our clinic review tool, if applicable. No additional management support is needed unless otherwise documented below in the visit note. 

## 2016-01-16 ENCOUNTER — Ambulatory Visit
Admission: RE | Admit: 2016-01-16 | Discharge: 2016-01-16 | Disposition: A | Discharge status: Home / Self Care | Payer: Medicare Other | Source: Ambulatory Visit

## 2016-01-16 DIAGNOSIS — Z1231 Encounter for screening mammogram for malignant neoplasm of breast: Secondary | ICD-10-CM

## 2016-01-17 LAB — HM MAMMOGRAPHY

## 2016-01-18 ENCOUNTER — Encounter: Payer: Self-pay | Admitting: *Deleted

## 2016-02-02 ENCOUNTER — Telehealth: Payer: Self-pay | Admitting: Family Medicine

## 2016-02-02 DIAGNOSIS — Z1211 Encounter for screening for malignant neoplasm of colon: Secondary | ICD-10-CM

## 2016-02-02 NOTE — Telephone Encounter (Signed)
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 Was not due for colon cancer screening. plz notify patient cologuard returned abnormal - however she has known polyps in the past and I think this is why it returned positive. I think we can await colonoscopy until 2020, but to let us know sooner if she notices any blood in stool or bowel changes. cologuard results placed in Kim's box for scanning.

## 2016-02-02 NOTE — Telephone Encounter (Signed)
Cecilie Lowers called to make sure Dr.Gutierrez received patient's results.  The results were positive.  Cecilie Lowers will fax another copy in case it wasn't received.

## 2016-02-03 NOTE — Telephone Encounter (Signed)
Left message on patient's voicemail to return call

## 2016-02-04 ENCOUNTER — Encounter: Payer: Self-pay | Admitting: Family Medicine

## 2016-02-06 NOTE — Telephone Encounter (Signed)
Message left advising patient to check Mychart.

## 2016-12-14 ENCOUNTER — Other Ambulatory Visit: Payer: Self-pay | Admitting: Family Medicine

## 2016-12-14 DIAGNOSIS — Z1231 Encounter for screening mammogram for malignant neoplasm of breast: Secondary | ICD-10-CM

## 2017-01-08 ENCOUNTER — Other Ambulatory Visit: Payer: Self-pay | Admitting: Family Medicine

## 2017-01-08 NOTE — Telephone Encounter (Signed)
No appt in over a year and no future appts., please advise

## 2017-01-16 ENCOUNTER — Ambulatory Visit
Admission: RE | Admit: 2017-01-16 | Discharge: 2017-01-16 | Disposition: A | Discharge status: Home / Self Care | Payer: Medicare Other | Source: Ambulatory Visit | Attending: Family Medicine | Admitting: Family Medicine

## 2017-01-16 DIAGNOSIS — Z1231 Encounter for screening mammogram for malignant neoplasm of breast: Secondary | ICD-10-CM | POA: Diagnosis not present

## 2017-02-11 ENCOUNTER — Other Ambulatory Visit: Payer: Self-pay | Admitting: Family Medicine

## 2017-02-11 DIAGNOSIS — E785 Hyperlipidemia, unspecified: Secondary | ICD-10-CM

## 2017-02-13 ENCOUNTER — Ambulatory Visit (INDEPENDENT_AMBULATORY_CARE_PROVIDER_SITE_OTHER): Payer: Medicare Other

## 2017-02-13 ENCOUNTER — Other Ambulatory Visit (INDEPENDENT_AMBULATORY_CARE_PROVIDER_SITE_OTHER): Payer: Medicare Other

## 2017-02-13 VITALS — BP 118/76 | HR 64 | Temp 98.4°F | Ht 63.0 in | Wt 190.0 lb

## 2017-02-13 DIAGNOSIS — E785 Hyperlipidemia, unspecified: Secondary | ICD-10-CM

## 2017-02-13 DIAGNOSIS — Z0000 Encounter for general adult medical examination without abnormal findings: Secondary | ICD-10-CM | POA: Diagnosis not present

## 2017-02-13 LAB — COMPREHENSIVE METABOLIC PANEL
ALT: 17 U/L (ref 0–35)
AST: 17 U/L (ref 0–37)
Albumin: 4.3 g/dL (ref 3.5–5.2)
Alkaline Phosphatase: 67 U/L (ref 39–117)
BUN: 15 mg/dL (ref 6–23)
CHLORIDE: 104 meq/L (ref 96–112)
CO2: 27 mEq/L (ref 19–32)
Calcium: 9.5 mg/dL (ref 8.4–10.5)
Creatinine, Ser: 0.75 mg/dL (ref 0.40–1.20)
GFR: 81.17 mL/min (ref >60.00)
Glucose, Bld: 104 mg/dL — ABNORMAL HIGH (ref 70–99)
POTASSIUM: 4 meq/L (ref 3.5–5.1)
SODIUM: 139 meq/L (ref 135–145)
Total Bilirubin: 0.6 mg/dL (ref 0.2–1.2)
Total Protein: 6.9 g/dL (ref 6.0–8.3)

## 2017-02-13 LAB — LIPID PANEL
CHOLESTEROL: 232 mg/dL — AB (ref 0–200)
HDL: 73.5 mg/dL (ref >39.00)
LDL CALC: 128 mg/dL — AB (ref 0–99)
NonHDL: 158.41
Total CHOL/HDL Ratio: 3
Triglycerides: 150 mg/dL — ABNORMAL HIGH (ref 0.0–149.0)
VLDL: 30 mg/dL (ref 0.0–40.0)

## 2017-02-13 NOTE — Progress Notes (Signed)
PCP notes:   Health maintenance:  PAP smear - PCP please address at well woman exam  Abnormal screenings:   None  Patient concerns:   None  Nurse concerns:  None  Next PCP appt:   02/18/17 @ 1130  I reviewed health advisor's note, was available for consultation, and agree with documentation and plan. Loura Pardon MD

## 2017-02-13 NOTE — Progress Notes (Signed)
Pre visit review using our clinic review tool, if applicable. No additional management support is needed unless otherwise documented below in the visit note. 

## 2017-02-13 NOTE — Patient Instructions (Signed)
Ms. Cremeens , Thank you for taking time to come for your Medicare Wellness Visit. I appreciate your ongoing commitment to your health goals. Please review the following plan we discussed and let me know if I can assist you in the future.   These are the goals we discussed: Goals    . Weight < 200 lb (90.719 kg)          Starting 02/13/2017, I will continue to drink more smoothies, reduce intake of desserts, and stop eating between meals in an effort to reach weight target of 170 lbs.        This is a list of the screening recommended for you and due dates:  Health Maintenance  Topic Date Due  . Pap Smear  02/18/2017*  . DTaP/Tdap/Td vaccine (1 - Tdap) 12/05/2022*  . Flu Shot  03/27/2017  . Colon Cancer Screening  01/02/2019  . Mammogram  01/17/2019  . Tetanus Vaccine  12/05/2022  . DEXA scan (bone density measurement)  Completed  .  Hepatitis C: One time screening is recommended by Center for Disease Control  (CDC) for  adults born from [birth year] through 1964.   Completed  . Pneumonia vaccines  Completed  *Topic was postponed. The date shown is not the original due date.   Preventive Care for Adults  A healthy lifestyle and preventive care can promote health and wellness. Preventive health guidelines for adults include the following key practices.  . A routine yearly physical is a good way to check with your health care provider about your health and preventive screening. It is a chance to share any concerns and updates on your health and to receive a thorough exam.  . Visit your dentist for a routine exam and preventive care every 6 months. Brush your teeth twice a day and floss once a day. Good oral hygiene prevents tooth decay and gum disease.  . The frequency of eye exams is based on your age, health, family medical history, use  of contact lenses, and other factors. Follow your health care provider's ecommendations for frequency of eye exams.  . Eat a healthy diet. Foods like  vegetables, fruits, whole grains, low-fat dairy products, and lean protein foods contain the nutrients you need without too many calories. Decrease your intake of foods high in solid fats, added sugars, and salt. Eat the right amount of calories for you. Get information about a proper diet from your health care provider, if necessary.  . Regular physical exercise is one of the most important things you can do for your health. Most adults should get at least 150 minutes of moderate-intensity exercise (any activity that increases your heart rate and causes you to sweat) each week. In addition, most adults need muscle-strengthening exercises on 2 or more days a week.  Silver Sneakers may be a benefit available to you. To determine eligibility, you may visit the website: www.silversneakers.com or contact program at (928)618-9519 Mon-Fri between 8AM-8PM.   . Maintain a healthy weight. The body mass index (BMI) is a screening tool to identify possible weight problems. It provides an estimate of body fat based on height and weight. Your health care provider can find your BMI and can help you achieve or maintain a healthy weight.   For adults 20 years and older: ? A BMI below 18.5 is considered underweight. ? A BMI of 18.5 to 24.9 is normal. ? A BMI of 25 to 29.9 is considered overweight. ? A BMI of 30 and above  is considered obese.   . Maintain normal blood lipids and cholesterol levels by exercising and minimizing your intake of saturated fat. Eat a balanced diet with plenty of fruit and vegetables. Blood tests for lipids and cholesterol should begin at age 57 and be repeated every 5 years. If your lipid or cholesterol levels are high, you are over 50, or you are at high risk for heart disease, you may need your cholesterol levels checked more frequently. Ongoing high lipid and cholesterol levels should be treated with medicines if diet and exercise are not working.  . If you smoke, find out from your  health care provider how to quit. If you do not use tobacco, please do not start.  . If you choose to drink alcohol, please do not consume more than 2 drinks per day. One drink is considered to be 12 ounces (355 mL) of beer, 5 ounces (148 mL) of wine, or 1.5 ounces (44 mL) of liquor.  . If you are 70-64 years old, ask your health care provider if you should take aspirin to prevent strokes.  . Use sunscreen. Apply sunscreen liberally and repeatedly throughout the day. You should seek shade when your shadow is shorter than you. Protect yourself by wearing long sleeves, pants, a wide-brimmed hat, and sunglasses year round, whenever you are outdoors.  . Once a month, do a whole body skin exam, using a mirror to look at the skin on your back. Tell your health care provider of new moles, moles that have irregular borders, moles that are larger than a pencil eraser, or moles that have changed in shape or color.

## 2017-02-13 NOTE — Progress Notes (Signed)
Subjective:   Victoria Morrison is a 71 y.o. female who presents for Medicare Annual (Subsequent) preventive examination.  Review of Systems:  N/A Cardiac Risk Factors include: advanced age (>70men, >86 women);dyslipidemia;hypertension;obesity (BMI >30kg/m2)     Objective:     Vitals: BP 118/76 (BP Location: Right Arm, Patient Position: Sitting, Cuff Size: Normal)   Pulse 64   Temp 98.4 F (36.9 C) (Oral)   Ht 5\' 3"  (1.6 m) Comment: no shoes  Wt 190 lb (86.2 kg)   SpO2 97%   BMI 33.66 kg/m   Body mass index is 33.66 kg/m.   Tobacco History  Smoking Status  . Former Smoker  . Quit date: 08/27/1968  Smokeless Tobacco  . Never Used     Counseling given: No   Past Medical History:  Diagnosis Date  . Allergy    SEASONAL  . Asymptomatic bacteriuria   . Cancer ([place])    SKIN  . Diverticulosis   . Environmental allergies    dust; pet dander; pollen  . History of asthma 1980s  . History of MRSA infection 2005   abdomen  . History of pneumonia 1980s  . History of rheumatoid arthritis 2009   in remission, off MTX since 2005, seronegative  . HLD (hyperlipidemia)   . HTN (hypertension)   . Hydradenitis 2005  . Osteoarthritis 2009   bilateral hands/knees s/p synvisc injections x3 ([facility])  . Osteopenia 12/2012   dexa with T score -1.9  . Postmenopausal 2000  . Seasonal allergic rhinitis    Past Surgical History:  Procedure Laterality Date  . APPENDECTOMY  1954  . COLONOSCOPY  12/2013   3 TAs, diverticulosis, rpt 5 yrs [facility] Plan)  . DEXA  12/2012   T score -1.3 femur, -1.9 radius  . MEDIAL PARTIAL KNEE REPLACEMENT Bilateral 09/2013   Dr Jefm Bryant  . MENISCUS REPAIR  2009   Left  . TONSILLECTOMY     childhood   Family History  Problem Relation Age of Onset  . Cancer Mother 19       breast  . Breast cancer Mother   . Cancer Maternal Grandmother 48       breast (or unsure)  . Breast cancer Maternal Grandmother   . Cancer Brother 44       lung (smoker)  .  Alcoholism Father   . CAD Father 46       MI  . Stroke Neg Hx   . Diabetes Neg Hx    History  Sexual Activity  . Sexual activity: No    Outpatient Encounter Prescriptions as of 02/13/2017  Medication Sig  . amLODipine (NORVASC) 5 MG tablet TAKE 1 TABLET (5 MG TOTAL) BY MOUTH DAILY.  . NON FORMULARY DoTerra Lifelong Vitality Supplement  . pravastatin (PRAVACHOL) 40 MG tablet Take 1 tablet (40 mg total) by mouth daily.  . [DISCONTINUED] olopatadine (PATANOL) 0.1 % ophthalmic solution Place 1 drop into both eyes 2 (two) times daily. (Patient taking differently: Place 1 drop into both eyes as needed. )   No facility-administered encounter medications on file as of 02/13/2017.     Activities of Daily Living In your present state of health, do you have any difficulty performing the following activities: 02/13/2017  Hearing? N  Vision? N  Difficulty concentrating or making decisions? N  Walking or climbing stairs? N  Dressing or bathing? N  Doing errands, shopping? N  Preparing Food and eating ? N  Using the Toilet? N  In the past  six months, have you accidently leaked urine? N  Do you have problems with loss of bowel control? N  Managing your Medications? N  Managing your Finances? N  Housekeeping or managing your Housekeeping? N  Some recent data might be hidden    Patient Care Team: Ria Bush, MD as PCP - General (Family Medicine) Leanor Kail, MD as Consulting Physician (Orthopedic Surgery) Troxler, Adele Schilder as Attending Physician (Podiatry)    Assessment:     Hearing Screening   125Hz  250Hz  500Hz  1000Hz  2000Hz  3000Hz  4000Hz  6000Hz  8000Hz   Right ear:   40 40 40  40    Left ear:   40 40 40  40    Vision Screening Comments: Last vision exam in June 2017 with Dr. Joan Mayans   Exercise Activities and Dietary recommendations Current Exercise Habits: Home exercise routine;Structured exercise class, Type of exercise: walking;Other - see comments (water  aerobics), Time (Minutes): > 60, Frequency (Times/Week): 5, Weekly Exercise (Minutes/Week): 0, Intensity: Moderate, Exercise limited by: None identified  Goals    . Weight < 200 lb (90.719 kg)          Starting 02/13/2017, I will continue to drink more smoothies, reduce intake of desserts, and stop eating between meals in an effort to reach weight target of 170 lbs.       Fall Risk Fall Risk  02/13/2017 01/03/2016 12/20/2014 12/09/2013 12/04/2012  Falls in the past year? No No No No No   Depression Screen PHQ 2/9 Scores 02/13/2017 01/03/2016 12/20/2014 12/09/2013  PHQ - 2 Score 0 0 0 0     Cognitive Function MMSE - Mini Mental State Exam 02/13/2017 01/03/2016  Orientation to time 5 5  Orientation to Place 5 5  Registration 3 3  Attention/ Calculation 0 0  Recall 3 3  Language- name 2 objects 0 0  Language- repeat 1 1  Language- follow 3 step command 3 3  Language- read & follow direction 0 0  Write a sentence 0 0  Copy design 0 0  Total score 20 20       PLEASE NOTE: A Mini-Cog screen was completed. Maximum score is 20. A value of 0 denotes this part of Folstein MMSE was not completed or the patient failed this part of the Mini-Cog screening.   Mini-Cog Screening Orientation to Time - Max 5 pts Orientation to Place - Max 5 pts Registration - Max 3 pts Recall - Max 3 pts Language Repeat - Max 1 pts Language Follow 3 Step Command - Max 3 pts   Immunization History  Administered Date(s) Administered  . Influenza,inj,Quad PF,36+ Mos 05/15/2013, 06/07/2014, 05/11/2015  . Pneumococcal Conjugate-13 12/09/2013  . Pneumococcal Polysaccharide-23 06/08/2004, 05/07/2012  . Td 12/04/2012  . Zoster 12/04/2012   Screening Tests Health Maintenance  Topic Date Due  . PAP SMEAR  02/18/2017 (Originally 12/10/2014)  . DTaP/Tdap/Td (1 - Tdap) 12/05/2022 (Originally 12/05/2012)  . INFLUENZA VACCINE  03/27/2017  . COLONOSCOPY  01/02/2019  . MAMMOGRAM  01/17/2019  . TETANUS/TDAP  12/05/2022  .  DEXA SCAN  Completed  . Hepatitis C Screening  Completed  . PNA vac Low Risk Adult  Completed      Plan:     I have personally reviewed and addressed the Medicare Annual Wellness questionnaire and have noted the following in the patient's chart:  A. Medical and social history B. Use of alcohol, tobacco or illicit drugs  C. Current medications and supplements D. Functional ability and status E.  Nutritional status F.  Physical activity G. Advance directives H. List of other physicians I.  Hospitalizations, surgeries, and ER visits in previous 12 months J.  [place] to include hearing, vision, cognitive, depression L. Referrals and appointments - none  In addition, I have reviewed and discussed with patient certain preventive protocols, quality metrics, and best practice recommendations. A written personalized care plan for preventive services as well as general preventive health recommendations were provided to patient.  See attached scanned questionnaire for additional information.   Signed,   Lindell Noe, MHA, BS, LPN Health Coach

## 2017-02-18 ENCOUNTER — Encounter: Payer: Self-pay | Admitting: Family Medicine

## 2017-02-18 ENCOUNTER — Other Ambulatory Visit (HOSPITAL_COMMUNITY)
Admission: RE | Admit: 2017-02-18 | Discharge: 2017-02-18 | Disposition: A | Discharge status: Home / Self Care | Payer: Medicare Other | Source: Ambulatory Visit | Attending: Family Medicine | Admitting: Family Medicine

## 2017-02-18 ENCOUNTER — Ambulatory Visit (INDEPENDENT_AMBULATORY_CARE_PROVIDER_SITE_OTHER): Payer: Medicare Other | Admitting: Family Medicine

## 2017-02-18 VITALS — BP 124/68 | HR 62 | Temp 98.5°F | Ht 63.0 in | Wt 188.5 lb

## 2017-02-18 DIAGNOSIS — E785 Hyperlipidemia, unspecified: Secondary | ICD-10-CM

## 2017-02-18 DIAGNOSIS — Z01419 Encounter for gynecological examination (general) (routine) without abnormal findings: Secondary | ICD-10-CM

## 2017-02-18 DIAGNOSIS — M150 Primary generalized (osteo)arthritis: Secondary | ICD-10-CM

## 2017-02-18 DIAGNOSIS — I10 Essential (primary) hypertension: Secondary | ICD-10-CM

## 2017-02-18 DIAGNOSIS — M159 Polyosteoarthritis, unspecified: Secondary | ICD-10-CM

## 2017-02-18 DIAGNOSIS — M8580 Other specified disorders of bone density and structure, unspecified site: Secondary | ICD-10-CM | POA: Diagnosis not present

## 2017-02-18 DIAGNOSIS — Z7189 Other specified counseling: Secondary | ICD-10-CM | POA: Diagnosis not present

## 2017-02-18 MED ORDER — PRAVASTATIN SODIUM 40 MG PO TABS: 40.0000 mg | ORAL_TABLET | Freq: Every day | ORAL | 3 refills | Status: DC

## 2017-02-18 MED ORDER — AMLODIPINE BESYLATE 5 MG PO TABS: 5.0000 mg | ORAL_TABLET | Freq: Every day | ORAL | 3 refills | Status: DC

## 2017-02-18 NOTE — Progress Notes (Signed)
BP 124/68   Pulse 62   Temp 98.5 F (36.9 C) (Oral)   Ht 5\' 3"  (1.6 m)   Wt 188 lb 8 oz (85.5 kg)   SpO2 96%   BMI 33.39 kg/m    CC: AMW f/u visit Subjective:    Patient ID: Victoria Morrison, female    DOB: 1946-08-03, 71 y.o.   MRN: [id number]  HPI: Victoria Morrison is a 71 y.o. female presenting on 02/18/2017 for Annual Exam (with pap)   Saw Katha Cabal last week for medicare wellness visit. Note reviewed.   She is involved in [employer] and brings me booklets to review.  Preventative: COLONOSCOPY Date: 12/2013 3 TAs, diverticulosis, rpt 5 yrs [facility]) [cologuard mistakenly completed last year as well]  Well woman - last 11/2013. Always normal paps in past.Would like to continue Q3 yrs - today done.  Mammogram normal 12/2016 Birads1. Gets yearly due to fmhx. Lung cancer screening - not due DEXA Date: 12/2012 T score -1.3 femur, -1.9 radius.  Flu shot - declined  Tetanus - 11/2012  Pneumovax - 05/07/2012, prevnar 11/2013  Zostavax - 11/2012  Shingrix - discussed. She will consider.  Advanced directives - scanned 12/2015. HCPOA is daughter Darrick Penna) then son Joneen Caraway). Living will in chart.  Seat belt use discussed Sunscreen use discussed, no changing moles on skin. Non smoker Alcohol - 2-3 glasses of wine several nights a week G4P4   Lives with husband, [name].  Occupation: retired, was Optician, dispensing  Edu: college  Activity: walks regularly, water aerobics 5d/wk  Diet: good water, vegetables daily, good calcium intake.   Relevant past medical, surgical, family and social history reviewed and updated as indicated. Interim medical history since our last visit reviewed. Allergies and medications reviewed and updated. Outpatient Medications Prior to Visit  Medication Sig Dispense Refill  . NON FORMULARY [employer] Lifelong Vitality Supplement    . amLODipine (NORVASC) 5 MG tablet TAKE 1 TABLET (5 MG TOTAL) BY MOUTH DAILY. 90 tablet 0  . pravastatin (PRAVACHOL) 40 MG tablet Take 1  tablet (40 mg total) by mouth daily. 90 tablet 3   No facility-administered medications prior to visit.      Per HPI unless specifically indicated in ROS section below Review of Systems     Objective:    BP 124/68   Pulse 62   Temp 98.5 F (36.9 C) (Oral)   Ht 5\' 3"  (1.6 m)   Wt 188 lb 8 oz (85.5 kg)   SpO2 96%   BMI 33.39 kg/m   Wt Readings from Last 3 Encounters:  02/18/17 188 lb 8 oz (85.5 kg)  02/13/17 190 lb (86.2 kg)  01/09/16 192 lb 8 oz (87.3 kg)    Physical Exam  Constitutional: She is oriented to person, place, and time. She appears well-developed and well-nourished. No distress.  HENT:  Head: Normocephalic and atraumatic.  Right Ear: Hearing, tympanic membrane, external ear and ear canal normal.  Left Ear: Hearing, tympanic membrane, external ear and ear canal normal.  Nose: Nose normal.  Mouth/Throat: Uvula is midline, oropharynx is clear and moist and mucous membranes are normal. No oropharyngeal exudate, posterior oropharyngeal edema or posterior oropharyngeal erythema.  Eyes: Conjunctivae and EOM are normal. Pupils are equal, round, and reactive to light. No scleral icterus.  Neck: Normal range of motion. Neck supple. Carotid bruit is not present. No thyromegaly present.  Cardiovascular: Normal rate, regular rhythm, normal heart sounds and intact distal pulses.   No murmur heard. Pulses:  Radial pulses are 2+ on the right side, and 2+ on the left side.  Pulmonary/Chest: Effort normal and breath sounds normal. No respiratory distress. She has no wheezes. She has no rales.  Abdominal: Soft. Bowel sounds are normal. She exhibits no distension and no mass. There is no tenderness. There is no rebound and no guarding.  Genitourinary: Vagina normal and uterus normal. There is no rash, tenderness or lesion on the right labia. There is no rash, tenderness or lesion on the left labia. Cervix exhibits no motion tenderness and no friability. Right adnexum displays no  mass, no tenderness and no fullness. Left adnexum displays no mass, no tenderness and no fullness.  Musculoskeletal: Normal range of motion. She exhibits no edema.  Lymphadenopathy:    She has no cervical adenopathy.  Neurological: She is alert and oriented to person, place, and time.  CN grossly intact, station and gait intact  Skin: Skin is warm and dry. No rash noted.  Psychiatric: She has a normal mood and affect. Her behavior is normal. Judgment and thought content normal.  Nursing note and vitals reviewed.  Results for orders placed or performed in visit on 02/13/17  Lipid panel  Result Value Ref Range   Cholesterol 232 (H) 0 - 200 mg/dL   Triglycerides 150.0 (H) 0.0 - 149.0 mg/dL   HDL 73.50 >39.00 mg/dL   VLDL 30.0 0.0 - 40.0 mg/dL   LDL Cholesterol 128 (H) 0 - 99 mg/dL   Total CHOL/HDL Ratio 3    NonHDL 158.41   Comprehensive metabolic panel  Result Value Ref Range   Sodium 139 135 - 145 mEq/L   Potassium 4.0 3.5 - 5.1 mEq/L   Chloride 104 96 - 112 mEq/L   CO2 27 19 - 32 mEq/L   Glucose, Bld 104 (H) 70 - 99 mg/dL   BUN 15 6 - 23 mg/dL   Creatinine, Ser 0.75 0.40 - 1.20 mg/dL   Total Bilirubin 0.6 0.2 - 1.2 mg/dL   Alkaline Phosphatase 67 39 - 117 U/L   AST 17 0 - 37 U/L   ALT 17 0 - 35 U/L   Total Protein 6.9 6.0 - 8.3 g/dL   Albumin 4.3 3.5 - 5.2 g/dL   Calcium 9.5 8.4 - 10.5 mg/dL   GFR 81.17 >60.00 mL/min      Assessment & Plan:   Problem List Items Addressed This Visit    Advanced care planning/counseling discussion    Advanced directives - scanned 12/2015. HCPOA is daughter Darrick Penna) then son Joneen Caraway). Living will in chart.       HLD (hyperlipidemia)    Chronic. Continue pravastatin 40mg  daily.  The 10-year ASCVD risk score Mikey Bussing DC Brooke Bonito., et al., 2013) is: 11.7%   Values used to calculate the score:     Age: 31 years     Sex: Female     Is Non-Hispanic African American: No     Diabetic: No     Tobacco smoker: No     Systolic Blood Pressure: 536  mmHg     Is BP treated: Yes     HDL Cholesterol: 73.5 mg/dL     Total Cholesterol: 232 mg/dL       Relevant Medications   pravastatin (PRAVACHOL) 40 MG tablet   amLODipine (NORVASC) 5 MG tablet   Other Relevant Orders   Lipid panel   TSH   Basic metabolic panel   HTN (hypertension) - Primary    Chronic, stable. Continue current regimen.  Relevant Medications   pravastatin (PRAVACHOL) 40 MG tablet   amLODipine (NORVASC) 5 MG tablet   Other Relevant Orders   TSH   Basic metabolic panel   Osteoarthritis   Osteopenia    Discussed calcium/vit D intake, weight bearing exercises. Consider updated DEXA 2019.        Other Visit Diagnoses    Encounter for gynecological examination without abnormal finding       Relevant Orders   Cytology - PAP [place]       Follow up plan: Return in about 1 year (around 02/18/2018) for medicare wellness visit.  Ria Bush, MD

## 2017-02-18 NOTE — Assessment & Plan Note (Signed)
Chronic. Continue pravastatin 40mg  daily.  The 10-year ASCVD risk score Mikey Bussing DC Brooke Bonito., et al., 2013) is: 11.7%   Values used to calculate the score:     Age: 71 years     Sex: Female     Is Non-Hispanic African American: No     Diabetic: No     Tobacco smoker: No     Systolic Blood Pressure: 255 mmHg     Is BP treated: Yes     HDL Cholesterol: 73.5 mg/dL     Total Cholesterol: 232 mg/dL

## 2017-02-18 NOTE — Assessment & Plan Note (Signed)
Advanced directives - scanned 12/2015. HCPOA is daughter Victoria Morrison) then son Victoria Morrison). Living will in chart.

## 2017-02-18 NOTE — Assessment & Plan Note (Signed)
Chronic, stable. Continue current regimen. 

## 2017-02-18 NOTE — Patient Instructions (Addendum)
Return in 6 months for lab visit only (cholesterol recheck).  Return in 1 year for next medicare wellness visit with Katha Cabal, follow up with me. Continue healthy diet and lifestyle changes. You are doing well today.   Health Maintenance, Female Adopting a healthy lifestyle and getting preventive care can go a long way to promote health and wellness. Talk with your health care provider about what schedule of regular examinations is right for you. This is a good chance for you to check in with your provider about disease prevention and staying healthy. In between checkups, there are plenty of things you can do on your own. Experts have done a lot of research about which lifestyle changes and preventive measures are most likely to keep you healthy. Ask your health care provider for more information. Weight and diet Eat a healthy diet  Be sure to include plenty of vegetables, fruits, low-fat dairy products, and lean protein.  Do not eat a lot of foods high in solid fats, added sugars, or salt.  Get regular exercise. This is one of the most important things you can do for your health. ? Most adults should exercise for at least 150 minutes each week. The exercise should increase your heart rate and make you sweat (moderate-intensity exercise). ? Most adults should also do strengthening exercises at least twice a week. This is in addition to the moderate-intensity exercise.  Maintain a healthy weight  Body mass index (BMI) is a measurement that can be used to identify possible weight problems. It estimates body fat based on height and weight. Your health care provider can help determine your BMI and help you achieve or maintain a healthy weight.  For females 70 years of age and older: ? A BMI below 18.5 is considered underweight. ? A BMI of 18.5 to 24.9 is normal. ? A BMI of 25 to 29.9 is considered overweight. ? A BMI of 30 and above is considered obese.  Watch levels of cholesterol and blood  lipids  You should start having your blood tested for lipids and cholesterol at 70 years of age, then have this test every 5 years.  You may need to have your cholesterol levels checked more often if: ? Your lipid or cholesterol levels are high. ? You are older than 70 years of age. ? You are at high risk for heart disease.  Cancer screening Lung Cancer  Lung cancer screening is recommended for adults 36-62 years old who are at high risk for lung cancer because of a history of smoking.  A yearly low-dose CT scan of the lungs is recommended for people who: ? Currently smoke. ? Have quit within the past 15 years. ? Have at least a 30-pack-year history of smoking. A pack year is smoking an average of one pack of cigarettes a day for 1 year.  Yearly screening should continue until it has been 15 years since you quit.  Yearly screening should stop if you develop a health problem that would prevent you from having lung cancer treatment.  Breast Cancer  Practice breast self-awareness. This means understanding how your breasts normally appear and feel.  It also means doing regular breast self-exams. Let your health care provider know about any changes, no matter how small.  If you are in your 20s or 30s, you should have a clinical breast exam (CBE) by a health care provider every 1-3 years as part of a regular health exam.  If you are 40 or older, have  a CBE every year. Also consider having a breast X-ray (mammogram) every year.  If you have a family history of breast cancer, talk to your health care provider about genetic screening.  If you are at high risk for breast cancer, talk to your health care provider about having an MRI and a mammogram every year.  Breast cancer gene (BRCA) assessment is recommended for women who have family members with BRCA-related cancers. BRCA-related cancers include: ? Breast. ? Ovarian. ? Tubal. ? Peritoneal cancers.  Results of the assessment will  determine the need for genetic counseling and BRCA1 and BRCA2 testing.  Cervical Cancer Your health care provider may recommend that you be screened regularly for cancer of the pelvic organs (ovaries, uterus, and vagina). This screening involves a pelvic examination, including checking for microscopic changes to the surface of your cervix (Pap test). You may be encouraged to have this screening done every 3 years, beginning at age 93.  For women ages 65-65, health care providers may recommend pelvic exams and Pap testing every 3 years, or they may recommend the Pap and pelvic exam, combined with testing for human papilloma virus (HPV), every 5 years. Some types of HPV increase your risk of cervical cancer. Testing for HPV may also be done on women of any age with unclear Pap test results.  Other health care providers may not recommend any screening for nonpregnant women who are considered low risk for pelvic cancer and who do not have symptoms. Ask your health care provider if a screening pelvic exam is right for you.  If you have had past treatment for cervical cancer or a condition that could lead to cancer, you need Pap tests and screening for cancer for at least 20 years after your treatment. If Pap tests have been discontinued, your risk factors (such as having a new sexual partner) need to be reassessed to determine if screening should resume. Some women have medical problems that increase the chance of getting cervical cancer. In these cases, your health care provider may recommend more frequent screening and Pap tests.  Colorectal Cancer  This type of cancer can be detected and often prevented.  Routine colorectal cancer screening usually begins at 70 years of age and continues through 70 years of age.  Your health care provider may recommend screening at an earlier age if you have risk factors for colon cancer.  Your health care provider may also recommend using home test kits to check  for hidden blood in the stool.  A small camera at the end of a tube can be used to examine your colon directly (sigmoidoscopy or colonoscopy). This is done to check for the earliest forms of colorectal cancer.  Routine screening usually begins at age 42.  Direct examination of the colon should be repeated every 5-10 years through 70 years of age. However, you may need to be screened more often if early forms of precancerous polyps or small growths are found.  Skin Cancer  Check your skin from head to toe regularly.  Tell your health care provider about any new moles or changes in moles, especially if there is a change in a mole's shape or color.  Also tell your health care provider if you have a mole that is larger than the size of a pencil eraser.  Always use sunscreen. Apply sunscreen liberally and repeatedly throughout the day.  Protect yourself by wearing long sleeves, pants, a wide-brimmed hat, and sunglasses whenever you are outside.  Heart  disease, diabetes, and high blood pressure  High blood pressure causes heart disease and increases the risk of stroke. High blood pressure is more likely to develop in: ? People who have blood pressure in the high end of the normal range (130-139/85-89 mm Hg). ? People who are overweight or obese. ? People who are African American.  If you are 64-16 years of age, have your blood pressure checked every 3-5 years. If you are 4 years of age or older, have your blood pressure checked every year. You should have your blood pressure measured twice-once when you are at a hospital or clinic, and once when you are not at a hospital or clinic. Record the average of the two measurements. To check your blood pressure when you are not at a hospital or clinic, you can use: ? An automated blood pressure machine at a pharmacy. ? A home blood pressure monitor.  If you are between 66 years and 26 years old, ask your health care provider if you should take  aspirin to prevent strokes.  Have regular diabetes screenings. This involves taking a blood sample to check your fasting blood sugar level. ? If you are at a normal weight and have a low risk for diabetes, have this test once every three years after 70 years of age. ? If you are overweight and have a high risk for diabetes, consider being tested at a younger age or more often. Preventing infection Hepatitis B  If you have a higher risk for hepatitis B, you should be screened for this virus. You are considered at high risk for hepatitis B if: ? You were born in a country where hepatitis B is common. Ask your health care provider which countries are considered high risk. ? Your parents were born in a high-risk country, and you have not been immunized against hepatitis B (hepatitis B vaccine). ? You have HIV or AIDS. ? You use needles to inject street drugs. ? You live with someone who has hepatitis B. ? You have had sex with someone who has hepatitis B. ? You get hemodialysis treatment. ? You take certain medicines for conditions, including cancer, organ transplantation, and autoimmune conditions.  Hepatitis C  Blood testing is recommended for: ? Everyone born from [birth year] through 1964. ? Anyone with known risk factors for hepatitis C.  Sexually transmitted infections (STIs)  You should be screened for sexually transmitted infections (STIs) including gonorrhea and chlamydia if: ? You are sexually active and are younger than 70 years of age. ? You are older than 70 years of age and your health care provider tells you that you are at risk for this type of infection. ? Your sexual activity has changed since you were last screened and you are at an increased risk for chlamydia or gonorrhea. Ask your health care provider if you are at risk.  If you do not have HIV, but are at risk, it may be recommended that you take a prescription medicine daily to prevent HIV infection. This is called  pre-exposure prophylaxis (PrEP). You are considered at risk if: ? You are sexually active and do not regularly use condoms or know the HIV status of your partner(s). ? You take drugs by injection. ? You are sexually active with a partner who has HIV.  Talk with your health care provider about whether you are at high risk of being infected with HIV. If you choose to begin PrEP, you should first be tested for HIV. You  should then be tested every 3 months for as long as you are taking PrEP. Pregnancy  If you are premenopausal and you may become pregnant, ask your health care provider about preconception counseling.  If you may become pregnant, take 400 to 800 micrograms (mcg) of folic acid every day.  If you want to prevent pregnancy, talk to your health care provider about birth control (contraception). Osteoporosis and menopause  Osteoporosis is a disease in which the bones lose minerals and strength with aging. This can result in serious bone fractures. Your risk for osteoporosis can be identified using a bone density scan.  If you are 49 years of age or older, or if you are at risk for osteoporosis and fractures, ask your health care provider if you should be screened.  Ask your health care provider whether you should take a calcium or vitamin D supplement to lower your risk for osteoporosis.  Menopause may have certain physical symptoms and risks.  Hormone replacement therapy may reduce some of these symptoms and risks. Talk to your health care provider about whether hormone replacement therapy is right for you. Follow these instructions at home:  Schedule regular health, dental, and eye exams.  Stay current with your immunizations.  Do not use any tobacco products including cigarettes, chewing tobacco, or electronic cigarettes.  If you are pregnant, do not drink alcohol.  If you are breastfeeding, limit how much and how often you drink alcohol.  Limit alcohol intake to no more  than 1 drink per day for nonpregnant women. One drink equals 12 ounces of beer, 5 ounces of wine, or 1 ounces of hard liquor.  Do not use street drugs.  Do not share needles.  Ask your health care provider for help if you need support or information about quitting drugs.  Tell your health care provider if you often feel depressed.  Tell your health care provider if you have ever been abused or do not feel safe at home. This information is not intended to replace advice given to you by your health care provider. Make sure you discuss any questions you have with your health care provider. Document Released: 02/26/2011 Document Revised: 01/19/2016 Document Reviewed: 05/17/2015 Elsevier Interactive Patient Education  [employer].

## 2017-02-18 NOTE — Assessment & Plan Note (Signed)
Discussed calcium/vit D intake, weight bearing exercises. Consider updated DEXA 2019.

## 2017-02-20 LAB — CYTOLOGY - PAP
Diagnosis: NEGATIVE
HPV: NOT DETECTED

## 2017-08-13 ENCOUNTER — Other Ambulatory Visit (INDEPENDENT_AMBULATORY_CARE_PROVIDER_SITE_OTHER): Payer: Medicare Other

## 2017-08-13 DIAGNOSIS — E785 Hyperlipidemia, unspecified: Secondary | ICD-10-CM

## 2017-08-13 DIAGNOSIS — I10 Essential (primary) hypertension: Secondary | ICD-10-CM | POA: Diagnosis not present

## 2017-08-13 LAB — TSH: TSH: 2.63 u[IU]/mL (ref 0.35–4.50)

## 2017-08-13 LAB — BASIC METABOLIC PANEL
BUN: 16 mg/dL (ref 6–23)
CO2: 29 mEq/L (ref 19–32)
CREATININE: 0.76 mg/dL (ref 0.40–1.20)
Calcium: 9.1 mg/dL (ref 8.4–10.5)
Chloride: 103 mEq/L (ref 96–112)
GFR: 79.82 mL/min (ref >60.00)
GLUCOSE: 92 mg/dL (ref 70–99)
POTASSIUM: 3.9 meq/L (ref 3.5–5.1)
Sodium: 138 mEq/L (ref 135–145)

## 2017-08-13 LAB — LIPID PANEL
CHOL/HDL RATIO: 3
Cholesterol: 211 mg/dL — ABNORMAL HIGH (ref 0–200)
HDL: 77.1 mg/dL (ref >39.00)
LDL Cholesterol: 99 mg/dL (ref 0–99)
NONHDL: 133.45
Triglycerides: 171 mg/dL — ABNORMAL HIGH (ref 0.0–149.0)
VLDL: 34.2 mg/dL (ref 0.0–40.0)

## 2017-12-04 ENCOUNTER — Other Ambulatory Visit: Payer: Self-pay | Admitting: Family Medicine

## 2017-12-04 ENCOUNTER — Telehealth: Payer: Self-pay | Admitting: Family Medicine

## 2017-12-04 DIAGNOSIS — E782 Mixed hyperlipidemia: Secondary | ICD-10-CM

## 2017-12-04 DIAGNOSIS — Z1231 Encounter for screening mammogram for malignant neoplasm of breast: Secondary | ICD-10-CM

## 2017-12-04 NOTE — Telephone Encounter (Signed)
Pt called to reschedule her medicare wellness with lisa and cpx with you till sept.  She will be out of town all summer. She wanted to know if she could have her chol checked sometime in May before she goes out of town

## 2017-12-04 NOTE — Telephone Encounter (Signed)
Orders placed for FLP in May. She may come in for fasting lab next month.

## 2017-12-04 NOTE — Telephone Encounter (Signed)
Spoke with pt relaying message per Dr. Darnell Level.  Says she will call back to schedule lab visit.

## 2018-01-09 ENCOUNTER — Other Ambulatory Visit (INDEPENDENT_AMBULATORY_CARE_PROVIDER_SITE_OTHER): Payer: Medicare Other

## 2018-01-09 DIAGNOSIS — E782 Mixed hyperlipidemia: Secondary | ICD-10-CM

## 2018-01-09 LAB — LIPID PANEL
Cholesterol: 224 mg/dL — ABNORMAL HIGH (ref 0–200)
HDL: 82.4 mg/dL (ref >39.00)
LDL Cholesterol: 112 mg/dL — ABNORMAL HIGH (ref 0–99)
NonHDL: 141.66
Total CHOL/HDL Ratio: 3
Triglycerides: 148 mg/dL (ref 0.0–149.0)
VLDL: 29.6 mg/dL (ref 0.0–40.0)

## 2018-01-17 ENCOUNTER — Ambulatory Visit
Admission: RE | Admit: 2018-01-17 | Discharge: 2018-01-17 | Disposition: A | Discharge status: Home / Self Care | Payer: Medicare Other | Source: Ambulatory Visit | Attending: Family Medicine | Admitting: Family Medicine

## 2018-01-17 DIAGNOSIS — Z1231 Encounter for screening mammogram for malignant neoplasm of breast: Secondary | ICD-10-CM

## 2018-01-17 LAB — HM MAMMOGRAPHY

## 2018-01-21 ENCOUNTER — Encounter: Payer: Self-pay | Admitting: Family Medicine

## 2018-02-14 ENCOUNTER — Ambulatory Visit: Payer: Medicare Other

## 2018-02-21 ENCOUNTER — Encounter: Payer: Medicare Other | Admitting: Family Medicine

## 2018-03-31 ENCOUNTER — Other Ambulatory Visit: Payer: Self-pay | Admitting: Family Medicine

## 2018-04-17 ENCOUNTER — Telehealth: Payer: Self-pay

## 2018-04-17 MED ORDER — PRAVASTATIN SODIUM 40 MG PO TABS: 40.0000 mg | ORAL_TABLET | Freq: Every day | ORAL | 0 refills | Status: DC

## 2018-04-17 NOTE — Telephone Encounter (Signed)
Sent refill

## 2018-05-19 ENCOUNTER — Ambulatory Visit: Payer: Medicare Other

## 2018-05-19 ENCOUNTER — Encounter: Payer: Self-pay | Admitting: Family Medicine

## 2018-05-20 ENCOUNTER — Ambulatory Visit: Payer: Medicare Other

## 2018-05-22 ENCOUNTER — Encounter: Payer: Medicare Other | Admitting: Family Medicine

## 2018-05-22 ENCOUNTER — Other Ambulatory Visit: Payer: Self-pay | Admitting: Family Medicine

## 2018-05-22 DIAGNOSIS — E785 Hyperlipidemia, unspecified: Secondary | ICD-10-CM

## 2018-05-22 DIAGNOSIS — I10 Essential (primary) hypertension: Secondary | ICD-10-CM

## 2018-05-22 DIAGNOSIS — R200 Anesthesia of skin: Secondary | ICD-10-CM

## 2018-05-23 ENCOUNTER — Other Ambulatory Visit (INDEPENDENT_AMBULATORY_CARE_PROVIDER_SITE_OTHER): Payer: Medicare Other

## 2018-05-23 DIAGNOSIS — R200 Anesthesia of skin: Secondary | ICD-10-CM

## 2018-05-23 DIAGNOSIS — E785 Hyperlipidemia, unspecified: Secondary | ICD-10-CM | POA: Diagnosis not present

## 2018-05-23 DIAGNOSIS — I10 Essential (primary) hypertension: Secondary | ICD-10-CM | POA: Diagnosis not present

## 2018-05-23 LAB — LIPID PANEL
CHOLESTEROL: 226 mg/dL — AB (ref 0–200)
HDL: 79.9 mg/dL (ref >39.00)
LDL CALC: 124 mg/dL — AB (ref 0–99)
NonHDL: 145.79
TRIGLYCERIDES: 110 mg/dL (ref 0.0–149.0)
Total CHOL/HDL Ratio: 3
VLDL: 22 mg/dL (ref 0.0–40.0)

## 2018-05-23 LAB — MICROALBUMIN / CREATININE URINE RATIO
Creatinine,U: 77.4 mg/dL
Microalb Creat Ratio: 0.9 mg/g (ref 0.0–30.0)
Microalb, Ur: <0.7 mg/dL (ref 0.0–1.9)

## 2018-05-23 LAB — VITAMIN B12: Vitamin B-12: 683 pg/mL (ref 211–911)

## 2018-05-23 LAB — BASIC METABOLIC PANEL
BUN: 14 mg/dL (ref 6–23)
CHLORIDE: 103 meq/L (ref 96–112)
CO2: 29 meq/L (ref 19–32)
Calcium: 9.4 mg/dL (ref 8.4–10.5)
Creatinine, Ser: 0.82 mg/dL (ref 0.40–1.20)
GFR: 72.96 mL/min (ref >60.00)
GLUCOSE: 96 mg/dL (ref 70–99)
Potassium: 4.1 mEq/L (ref 3.5–5.1)
Sodium: 140 mEq/L (ref 135–145)

## 2018-05-27 ENCOUNTER — Encounter: Payer: Self-pay | Admitting: Family Medicine

## 2018-05-27 ENCOUNTER — Ambulatory Visit (INDEPENDENT_AMBULATORY_CARE_PROVIDER_SITE_OTHER): Payer: Medicare Other

## 2018-05-27 ENCOUNTER — Ambulatory Visit (INDEPENDENT_AMBULATORY_CARE_PROVIDER_SITE_OTHER): Payer: Medicare Other | Admitting: Family Medicine

## 2018-05-27 VITALS — BP 104/70 | HR 84 | Temp 98.5°F | Ht 63.25 in | Wt 186.5 lb

## 2018-05-27 DIAGNOSIS — E785 Hyperlipidemia, unspecified: Secondary | ICD-10-CM | POA: Diagnosis not present

## 2018-05-27 DIAGNOSIS — M8580 Other specified disorders of bone density and structure, unspecified site: Secondary | ICD-10-CM | POA: Diagnosis not present

## 2018-05-27 DIAGNOSIS — R109 Unspecified abdominal pain: Secondary | ICD-10-CM | POA: Insufficient documentation

## 2018-05-27 DIAGNOSIS — Z0000 Encounter for general adult medical examination without abnormal findings: Secondary | ICD-10-CM

## 2018-05-27 DIAGNOSIS — I10 Essential (primary) hypertension: Secondary | ICD-10-CM

## 2018-05-27 NOTE — Assessment & Plan Note (Signed)
Slowly improving - anticipate mild case of food poisoning.

## 2018-05-27 NOTE — Assessment & Plan Note (Signed)
Chronic, stable. Low readings today - I asked her to start monitoring BP at home and let me know if desires to try lower amlodipine dosing.

## 2018-05-27 NOTE — Assessment & Plan Note (Signed)
Chronic, stable on statin. Discussed dietary choices to improve LDL. The 10-year ASCVD risk score Mikey Bussing DC Brooke Bonito., et al., 2013) is: 9.2%   Values used to calculate the score:     Age: 72 years     Sex: Female     Is Non-Hispanic African American: No     Diabetic: No     Tobacco smoker: No     Systolic Blood Pressure: 389 mmHg     Is BP treated: Yes     HDL Cholesterol: 79.9 mg/dL     Total Cholesterol: 226 mg/dL

## 2018-05-27 NOTE — Patient Instructions (Addendum)
Watch BP readings at home, if staying on the low side, let me know and I will send in lower dose of amlodipine.  Goal calcium is '1200mg'$  per day - try to get as much as you can from diet.  You are doing well today. Return as needed or in 1 year for next wellness visit.  Health Maintenance, Female Adopting a healthy lifestyle and getting preventive care can go a long way to promote health and wellness. Talk with your health care provider about what schedule of regular examinations is right for you. This is a good chance for you to check in with your provider about disease prevention and staying healthy. In between checkups, there are plenty of things you can do on your own. Experts have done a lot of research about which lifestyle changes and preventive measures are most likely to keep you healthy. Ask your health care provider for more information. Weight and diet Eat a healthy diet  Be sure to include plenty of vegetables, fruits, low-fat dairy products, and lean protein.  Do not eat a lot of foods high in solid fats, added sugars, or salt.  Get regular exercise. This is one of the most important things you can do for your health. ? Most adults should exercise for at least 150 minutes each week. The exercise should increase your heart rate and make you sweat (moderate-intensity exercise). ? Most adults should also do strengthening exercises at least twice a week. This is in addition to the moderate-intensity exercise.  Maintain a healthy weight  Body mass index (BMI) is a measurement that can be used to identify possible weight problems. It estimates body fat based on height and weight. Your health care provider can help determine your BMI and help you achieve or maintain a healthy weight.  For females 71 years of age and older: ? A BMI below 18.5 is considered underweight. ? A BMI of 18.5 to 24.9 is normal. ? A BMI of 25 to 29.9 is considered overweight. ? A BMI of 30 and above is  considered obese.  Watch levels of cholesterol and blood lipids  You should start having your blood tested for lipids and cholesterol at 71 years of age, then have this test every 5 years.  You may need to have your cholesterol levels checked more often if: ? Your lipid or cholesterol levels are high. ? You are older than 71 years of age. ? You are at high risk for heart disease.  Cancer screening Lung Cancer  Lung cancer screening is recommended for adults 84-55 years old who are at high risk for lung cancer because of a history of smoking.  A yearly low-dose CT scan of the lungs is recommended for people who: ? Currently smoke. ? Have quit within the past 15 years. ? Have at least a 30-pack-year history of smoking. A pack year is smoking an average of one pack of cigarettes a day for 1 year.  Yearly screening should continue until it has been 15 years since you quit.  Yearly screening should stop if you develop a health problem that would prevent you from having lung cancer treatment.  Breast Cancer  Practice breast self-awareness. This means understanding how your breasts normally appear and feel.  It also means doing regular breast self-exams. Let your health care provider know about any changes, no matter how small.  If you are in your 20s or 30s, you should have a clinical breast exam (CBE) by a health  care provider every 1-3 years as part of a regular health exam.  If you are 88 or older, have a CBE every year. Also consider having a breast X-ray (mammogram) every year.  If you have a family history of breast cancer, talk to your health care provider about genetic screening.  If you are at high risk for breast cancer, talk to your health care provider about having an MRI and a mammogram every year.  Breast cancer gene (BRCA) assessment is recommended for women who have family members with BRCA-related cancers. BRCA-related cancers  include: ? Breast. ? Ovarian. ? Tubal. ? Peritoneal cancers.  Results of the assessment will determine the need for genetic counseling and BRCA1 and BRCA2 testing.  Cervical Cancer Your health care provider may recommend that you be screened regularly for cancer of the pelvic organs (ovaries, uterus, and vagina). This screening involves a pelvic examination, including checking for microscopic changes to the surface of your cervix (Pap test). You may be encouraged to have this screening done every 3 years, beginning at age 67.  For women ages 15-65, health care providers may recommend pelvic exams and Pap testing every 3 years, or they may recommend the Pap and pelvic exam, combined with testing for human papilloma virus (HPV), every 5 years. Some types of HPV increase your risk of cervical cancer. Testing for HPV may also be done on women of any age with unclear Pap test results.  Other health care providers may not recommend any screening for nonpregnant women who are considered low risk for pelvic cancer and who do not have symptoms. Ask your health care provider if a screening pelvic exam is right for you.  If you have had past treatment for cervical cancer or a condition that could lead to cancer, you need Pap tests and screening for cancer for at least 20 years after your treatment. If Pap tests have been discontinued, your risk factors (such as having a new sexual partner) need to be reassessed to determine if screening should resume. Some women have medical problems that increase the chance of getting cervical cancer. In these cases, your health care provider may recommend more frequent screening and Pap tests.  Colorectal Cancer  This type of cancer can be detected and often prevented.  Routine colorectal cancer screening usually begins at 71 years of age and continues through 71 years of age.  Your health care provider may recommend screening at an earlier age if you have risk factors  for colon cancer.  Your health care provider may also recommend using home test kits to check for hidden blood in the stool.  A small camera at the end of a tube can be used to examine your colon directly (sigmoidoscopy or colonoscopy). This is done to check for the earliest forms of colorectal cancer.  Routine screening usually begins at age 21.  Direct examination of the colon should be repeated every 5-10 years through 71 years of age. However, you may need to be screened more often if early forms of precancerous polyps or small growths are found.  Skin Cancer  Check your skin from head to toe regularly.  Tell your health care provider about any new moles or changes in moles, especially if there is a change in a mole's shape or color.  Also tell your health care provider if you have a mole that is larger than the size of a pencil eraser.  Always use sunscreen. Apply sunscreen liberally and repeatedly throughout the  day.  Protect yourself by wearing long sleeves, pants, a wide-brimmed hat, and sunglasses whenever you are outside.  Heart disease, diabetes, and high blood pressure  High blood pressure causes heart disease and increases the risk of stroke. High blood pressure is more likely to develop in: ? People who have blood pressure in the high end of the normal range (130-139/85-89 mm Hg). ? People who are overweight or obese. ? People who are African American.  If you are 33-25 years of age, have your blood pressure checked every 3-5 years. If you are 54 years of age or older, have your blood pressure checked every year. You should have your blood pressure measured twice-once when you are at a hospital or clinic, and once when you are not at a hospital or clinic. Record the average of the two measurements. To check your blood pressure when you are not at a hospital or clinic, you can use: ? An automated blood pressure machine at a pharmacy. ? A home blood pressure monitor.  If  you are between 59 years and 73 years old, ask your health care provider if you should take aspirin to prevent strokes.  Have regular diabetes screenings. This involves taking a blood sample to check your fasting blood sugar level. ? If you are at a normal weight and have a low risk for diabetes, have this test once every three years after 71 years of age. ? If you are overweight and have a high risk for diabetes, consider being tested at a younger age or more often. Preventing infection Hepatitis B  If you have a higher risk for hepatitis B, you should be screened for this virus. You are considered at high risk for hepatitis B if: ? You were born in a country where hepatitis B is common. Ask your health care provider which countries are considered high risk. ? Your parents were born in a high-risk country, and you have not been immunized against hepatitis B (hepatitis B vaccine). ? You have HIV or AIDS. ? You use needles to inject street drugs. ? You live with someone who has hepatitis B. ? You have had sex with someone who has hepatitis B. ? You get hemodialysis treatment. ? You take certain medicines for conditions, including cancer, organ transplantation, and autoimmune conditions.  Hepatitis C  Blood testing is recommended for: ? Everyone born from [birth year] through 1964. ? Anyone with known risk factors for hepatitis C.  Sexually transmitted infections (STIs)  You should be screened for sexually transmitted infections (STIs) including gonorrhea and chlamydia if: ? You are sexually active and are younger than 72 years of age. ? You are older than 71 years of age and your health care provider tells you that you are at risk for this type of infection. ? Your sexual activity has changed since you were last screened and you are at an increased risk for chlamydia or gonorrhea. Ask your health care provider if you are at risk.  If you do not have HIV, but are at risk, it may be recommended  that you take a prescription medicine daily to prevent HIV infection. This is called pre-exposure prophylaxis (PrEP). You are considered at risk if: ? You are sexually active and do not regularly use condoms or know the HIV status of your partner(s). ? You take drugs by injection. ? You are sexually active with a partner who has HIV.  Talk with your health care provider about whether you are at high  risk of being infected with HIV. If you choose to begin PrEP, you should first be tested for HIV. You should then be tested every 3 months for as long as you are taking PrEP. Pregnancy  If you are premenopausal and you may become pregnant, ask your health care provider about preconception counseling.  If you may become pregnant, take 400 to 800 micrograms (mcg) of folic acid every day.  If you want to prevent pregnancy, talk to your health care provider about birth control (contraception). Osteoporosis and menopause  Osteoporosis is a disease in which the bones lose minerals and strength with aging. This can result in serious bone fractures. Your risk for osteoporosis can be identified using a bone density scan.  If you are 70 years of age or older, or if you are at risk for osteoporosis and fractures, ask your health care provider if you should be screened.  Ask your health care provider whether you should take a calcium or vitamin D supplement to lower your risk for osteoporosis.  Menopause may have certain physical symptoms and risks.  Hormone replacement therapy may reduce some of these symptoms and risks. Talk to your health care provider about whether hormone replacement therapy is right for you. Follow these instructions at home:  Schedule regular health, dental, and eye exams.  Stay current with your immunizations.  Do not use any tobacco products including cigarettes, chewing tobacco, or electronic cigarettes.  If you are pregnant, do not drink alcohol.  If you are  breastfeeding, limit how much and how often you drink alcohol.  Limit alcohol intake to no more than 1 drink per day for nonpregnant women. One drink equals 12 ounces of beer, 5 ounces of wine, or 1 ounces of hard liquor.  Do not use street drugs.  Do not share needles.  Ask your health care provider for help if you need support or information about quitting drugs.  Tell your health care provider if you often feel depressed.  Tell your health care provider if you have ever been abused or do not feel safe at home. This information is not intended to replace advice given to you by your health care provider. Make sure you discuss any questions you have with your health care provider. Document Released: 02/26/2011 Document Revised: 01/19/2016 Document Reviewed: 05/17/2015 Elsevier Interactive Patient Education  [employer].

## 2018-05-27 NOTE — Patient Instructions (Signed)
Ms. Widmann , Thank you for taking time to come for your Medicare Wellness Visit. I appreciate your ongoing commitment to your health goals. Please review the following plan we discussed and let me know if I can assist you in the future.   These are the goals we discussed: Goals    . Increase physical activity     Starting 05/27/2018, I will continue to walk 1-2 miles daily.        This is a list of the screening recommended for you and due dates:  Health Maintenance  Topic Date Due  . Flu Shot  11/26/2018*  . DTaP/Tdap/Td vaccine (1 - Tdap) 12/05/2022*  . Colon Cancer Screening  01/02/2019  . Mammogram  01/18/2019  . Tetanus Vaccine  12/05/2022  . DEXA scan (bone density measurement)  Completed  .  Hepatitis C: One time screening is recommended by Center for Disease Control  (CDC) for  adults born from [birth year] through 1964.   Completed  . Pneumonia vaccines  Completed  *Topic was postponed. The date shown is not the original due date.   Preventive Care for Adults  A healthy lifestyle and preventive care can promote health and wellness. Preventive health guidelines for adults include the following key practices.  . A routine yearly physical is a good way to check with your health care provider about your health and preventive screening. It is a chance to share any concerns and updates on your health and to receive a thorough exam.  . Visit your dentist for a routine exam and preventive care every 6 months. Brush your teeth twice a day and floss once a day. Good oral hygiene prevents tooth decay and gum disease.  . The frequency of eye exams is based on your age, health, family medical history, use  of contact lenses, and other factors. Follow your health care provider's recommendations for frequency of eye exams.  . Eat a healthy diet. Foods like vegetables, fruits, whole grains, low-fat dairy products, and lean protein foods contain the nutrients you need without too many calories.  Decrease your intake of foods high in solid fats, added sugars, and salt. Eat the right amount of calories for you. Get information about a proper diet from your health care provider, if necessary.  . Regular physical exercise is one of the most important things you can do for your health. Most adults should get at least 150 minutes of moderate-intensity exercise (any activity that increases your heart rate and causes you to sweat) each week. In addition, most adults need muscle-strengthening exercises on 2 or more days a week.  Silver Sneakers may be a benefit available to you. To determine eligibility, you may visit the website: www.silversneakers.com or contact program at 902-167-6924 Mon-Fri between 8AM-8PM.   . Maintain a healthy weight. The body mass index (BMI) is a screening tool to identify possible weight problems. It provides an estimate of body fat based on height and weight. Your health care provider can find your BMI and can help you achieve or maintain a healthy weight.   For adults 20 years and older: ? A BMI below 18.5 is considered underweight. ? A BMI of 18.5 to 24.9 is normal. ? A BMI of 25 to 29.9 is considered overweight. ? A BMI of 30 and above is considered obese.   . Maintain normal blood lipids and cholesterol levels by exercising and minimizing your intake of saturated fat. Eat a balanced diet with plenty of fruit and vegetables.  Blood tests for lipids and cholesterol should begin at age 25 and be repeated every 5 years. If your lipid or cholesterol levels are high, you are over 50, or you are at high risk for heart disease, you may need your cholesterol levels checked more frequently. Ongoing high lipid and cholesterol levels should be treated with medicines if diet and exercise are not working.  . If you smoke, find out from your health care provider how to quit. If you do not use tobacco, please do not start.  . If you choose to drink alcohol, please do not consume  more than 2 drinks per day. One drink is considered to be 12 ounces (355 mL) of beer, 5 ounces (148 mL) of wine, or 1.5 ounces (44 mL) of liquor.  . If you are 37-69 years old, ask your health care provider if you should take aspirin to prevent strokes.  . Use sunscreen. Apply sunscreen liberally and repeatedly throughout the day. You should seek shade when your shadow is shorter than you. Protect yourself by wearing long sleeves, pants, a wide-brimmed hat, and sunglasses year round, whenever you are outdoors.  . Once a month, do a whole body skin exam, using a mirror to look at the skin on your back. Tell your health care provider of new moles, moles that have irregular borders, moles that are larger than a pencil eraser, or moles that have changed in shape or color.

## 2018-05-27 NOTE — Assessment & Plan Note (Addendum)
Reviewed latest DEXA. Discussed recommended calcium intake daily, discussed optimally from diet. She takes doterra bone health vitamin.  Consider updating DEXA in the future.

## 2018-05-27 NOTE — Progress Notes (Signed)
PCP notes:   Health maintenance:  Flu vaccine - pt declined  Abnormal screenings:   None  Patient concerns:   Possible food poisoning - lower abdomen pain and cramping 3/10  Nurse concerns:  None  Next PCP appt:   05/27/2018 @ 0930

## 2018-05-27 NOTE — Progress Notes (Signed)
BP 104/70 (BP Location: Right Arm, Patient Position: Sitting, Cuff Size: Normal)   Pulse 84   Temp 98.5 F (36.9 C) (Oral)   Ht 5' 3.25" (1.607 m)   Wt 186 lb 8 oz (84.6 kg)   SpO2 97%   BMI 32.78 kg/m   BP Readings from Last 3 Encounters:  05/27/18 104/70  05/27/18 104/70  02/18/17 124/68   CC: AMW f/u visit Subjective:    Patient ID: Victoria Morrison, female    DOB: [birth year], 72 y.o.   MRN: [id number]  HPI: Victoria Morrison is a 72 y.o. female presenting on 05/27/2018 for Annual Exam (Part 2)   Saw Katha Cabal today for medicare wellness visit. Note will be reviewed.  Concern over food poisoning this weekend - abdominal cramps and abdominal pain without bowel changes or nausea/vomiting, fever. Slowly improving. Started after eating hotdog with suspect ketchup at outdoor church function.   Preventative: COLONOSCOPY Date: 12/2013 3 TAs, diverticulosis, rpt 5 yrs [facility] Plan)  Well woman - last 01/2017, normal with absent endocervical zone due to atrophy. Always normal paps in past.Would like to continue Q3 yrs.  Mammogram normal 12/2017 Birads1. Gets yearly due to fmhx. Lung cancer screening - not eligible DEXA Date: 12/2012 T score -1.3 femur, -1.9 radius. takes bone supplement from [facility]. Walks regularly.  Flu shot - declined  Tetanus - 11/2012  Pneumovax - 05/07/2012, prevnar 11/2013  Zostavax - 11/2012  Shingrix - discussed - declines Advanced directives - scanned 12/2015. HCPOA is daughter Darrick Penna) then son Joneen Caraway). Living will in chart.  Seat belt use discussed Sunscreen use discussed, no changing moles on skin. Non smoker  Alcohol - 2-3 glasses of white wine several nights a week Dentist Q6 mo Eye exam yearly G90P4   Lives with husband, [name].  Occupation: retired, was Optician, dispensing  Edu: college  Activity: walks regularly, water aerobics 5d/wk  Diet: good water, vegetables daily, good calcium intake.   Relevant past medical, surgical, family and social history reviewed and  updated as indicated. Interim medical history since our last visit reviewed. Allergies and medications reviewed and updated. Outpatient Medications Prior to Visit  Medication Sig Dispense Refill  . amLODipine (NORVASC) 5 MG tablet TAKE 1 TABLET BY MOUTH EVERY DAY 90 tablet 0  . NON FORMULARY DoTerra Lifelong Vitality Supplement    . pravastatin (PRAVACHOL) 40 MG tablet Take 1 tablet (40 mg total) by mouth daily. 90 tablet 0   No facility-administered medications prior to visit.      Per HPI unless specifically indicated in ROS section below Review of Systems     Objective:    BP 104/70 (BP Location: Right Arm, Patient Position: Sitting, Cuff Size: Normal)   Pulse 84   Temp 98.5 F (36.9 C) (Oral)   Ht 5' 3.25" (1.607 m)   Wt 186 lb 8 oz (84.6 kg)   SpO2 97%   BMI 32.78 kg/m   Wt Readings from Last 3 Encounters:  05/27/18 186 lb 8 oz (84.6 kg)  05/27/18 186 lb 8 oz (84.6 kg)  02/18/17 188 lb 8 oz (85.5 kg)    Physical Exam  Constitutional: She is oriented to person, place, and time. She appears well-developed and well-nourished. No distress.  HENT:  Head: Normocephalic and atraumatic.  Right Ear: Hearing, tympanic membrane, external ear and ear canal normal.  Left Ear: Hearing, tympanic membrane, external ear and ear canal normal.  Nose: Nose normal.  Mouth/Throat: Uvula is midline, oropharynx is clear and moist and mucous membranes are  normal. No oropharyngeal exudate, posterior oropharyngeal edema or posterior oropharyngeal erythema.  Eyes: Pupils are equal, round, and reactive to light. Conjunctivae and EOM are normal. No scleral icterus.  Neck: Normal range of motion. Neck supple. Carotid bruit is not present. No thyromegaly present.  Cardiovascular: Normal rate, regular rhythm, normal heart sounds and intact distal pulses.  No murmur heard. Pulses:      Radial pulses are 2+ on the right side, and 2+ on the left side.  Pulmonary/Chest: Effort normal and breath sounds  normal. No respiratory distress. She has no wheezes. She has no rales.  Abdominal: Soft. She exhibits no distension and no mass. Bowel sounds are increased. There is no tenderness. There is no rebound and no guarding.  Musculoskeletal: Normal range of motion. She exhibits no edema.  Lymphadenopathy:    She has no cervical adenopathy.  Neurological: She is alert and oriented to person, place, and time.  CN grossly intact, station and gait intact  Skin: Skin is warm and dry. No rash noted.  Psychiatric: She has a normal mood and affect. Her behavior is normal. Judgment and thought content normal.  Nursing note and vitals reviewed.  Results for orders placed or performed in visit on 05/23/18  Vitamin B12  Result Value Ref Range   Vitamin B-12 683 211 - 911 pg/mL  Basic metabolic panel  Result Value Ref Range   Sodium 140 135 - 145 mEq/L   Potassium 4.1 3.5 - 5.1 mEq/L   Chloride 103 96 - 112 mEq/L   CO2 29 19 - 32 mEq/L   Glucose, Bld 96 70 - 99 mg/dL   BUN 14 6 - 23 mg/dL   Creatinine, Ser 0.82 0.40 - 1.20 mg/dL   Calcium 9.4 8.4 - 10.5 mg/dL   GFR 72.96 >60.00 mL/min  Microalbumin / creatinine urine ratio  Result Value Ref Range   Microalb, Ur <0.7 0.0 - 1.9 mg/dL   Creatinine,U 77.4 mg/dL   Microalb Creat Ratio 0.9 0.0 - 30.0 mg/g  Lipid panel  Result Value Ref Range   Cholesterol 226 (H) 0 - 200 mg/dL   Triglycerides 110.0 0.0 - 149.0 mg/dL   HDL 79.90 >39.00 mg/dL   VLDL 22.0 0.0 - 40.0 mg/dL   LDL Cholesterol 124 (H) 0 - 99 mg/dL   Total CHOL/HDL Ratio 3    NonHDL 145.79       Assessment & Plan:   Problem List Items Addressed This Visit    Osteopenia    Reviewed latest DEXA. Discussed recommended calcium intake daily, discussed optimally from diet. She takes doterra bone health vitamin.  Consider updating DEXA in the future.       HTN (hypertension) - Primary    Chronic, stable. Low readings today - I asked her to start monitoring BP at home and let me know if  desires to try lower amlodipine dosing.      HLD (hyperlipidemia)    Chronic, stable on statin. Discussed dietary choices to improve LDL. The 10-year ASCVD risk score Mikey Bussing DC Brooke Bonito., et al., 2013) is: 9.2%   Values used to calculate the score:     Age: 7 years     Sex: Female     Is Non-Hispanic African American: No     Diabetic: No     Tobacco smoker: No     Systolic Blood Pressure: 833 mmHg     Is BP treated: Yes     HDL Cholesterol: 79.9 mg/dL     Total  Cholesterol: 226 mg/dL       Abdominal discomfort    Slowly improving - anticipate mild case of food poisoning.          No orders of the defined types were placed in this encounter.  No orders of the defined types were placed in this encounter.   Follow up plan: Return in about 1 year (around 05/28/2019), or if symptoms worsen or fail to improve, for follow up visit, medicare wellness visit.  Ria Bush, MD

## 2018-05-27 NOTE — Progress Notes (Signed)
Subjective:   Victoria Morrison is a 72 y.o. female who presents for Medicare Annual (Subsequent) preventive examination.  Review of Systems:  N/A Cardiac Risk Factors include: advanced age (>60men, >4 women);dyslipidemia;hypertension;obesity (BMI >30kg/m2)     Objective:     Vitals: BP 104/70 (BP Location: Right Arm, Patient Position: Sitting, Cuff Size: Normal)   Pulse 84   Temp 98.5 F (36.9 C) (Oral)   Ht 5' 3.25" (1.607 m) Comment: no shoes  Wt 186 lb 8 oz (84.6 kg)   SpO2 97%   BMI 32.78 kg/m   Body mass index is 32.78 kg/m.  Advanced Directives 05/27/2018 02/13/2017 01/03/2016  Does Patient Have a Medical Advance Directive? Yes Yes Yes  Type of Paramedic of [place];Living will [place];Living will Living will  Does patient want to make changes to medical advance directive? - - No - Patient declined  Copy of [place] in Chart? No - copy requested Yes Yes    Tobacco Social History   Tobacco Use  Smoking Status Former Smoker  . Last attempt to quit: 08/27/1968  . Years since quitting: 49.7  Smokeless Tobacco Never Used     Counseling given: No   Clinical Intake:  Pre-visit preparation completed: Yes  Pain : 0-10 Pain Score: 3  Pain Type: Acute pain Pain Descriptors / Indicators: Cramping Pain Onset: In the past 7 days Pain Frequency: Intermittent     Nutritional Status: BMI > 30  Obese Nutritional Risks: None Diabetes: No  How often do you need to have someone help you when you read instructions, pamphlets, or other written materials from your doctor or pharmacy?: 1 - Never What is the last grade level you completed in school?: Bachelor degree  Interpreter Needed?: No  Comments: pt lives with spouse Information entered by :: LPinson, LPN  Past Medical History:  Diagnosis Date  . Allergy    SEASONAL  . Asymptomatic bacteriuria   . Cancer ([place])    SKIN  . Diverticulosis   .  Environmental allergies    dust; pet dander; pollen  . History of asthma 1980s  . History of MRSA infection 2005   abdomen  . History of pneumonia 1980s  . History of rheumatoid arthritis 2009   in remission, off MTX since 2005, seronegative  . HLD (hyperlipidemia)   . HTN (hypertension)   . Hydradenitis 2005  . Osteoarthritis 2009   bilateral hands/knees s/p synvisc injections x3 ([facility])  . Osteopenia 12/2012   dexa with T score -1.9  . Postmenopausal 2000  . Seasonal allergic rhinitis    Past Surgical History:  Procedure Laterality Date  . APPENDECTOMY  1954  . COLONOSCOPY  12/2013   3 TAs, diverticulosis, rpt 5 yrs [facility] Plan)  . DEXA  12/2012   T score -1.3 femur, -1.9 radius  . MEDIAL PARTIAL KNEE REPLACEMENT Bilateral 09/2013   Dr Jefm Bryant  . MENISCUS REPAIR  2009   Left  . TONSILLECTOMY     childhood   Family History  Problem Relation Age of Onset  . Cancer Mother 48       breast  . Breast cancer Mother   . Cancer Maternal Grandmother 48       breast (or unsure)  . Breast cancer Maternal Grandmother   . Cancer Brother 77       lung (smoker)  . Alcoholism Father   . CAD Father 20       MI  .  Stroke Neg Hx   . Diabetes Neg Hx    Social History   Socioeconomic History  . Marital status: Married    Spouse name: Not on file  . Number of children: Not on file  . Years of education: Not on file  . Highest education level: Not on file  Occupational History  . Not on file  Social Needs  . Financial resource strain: Not on file  . Food insecurity:    Worry: Not on file    Inability: Not on file  . Transportation needs:    Medical: Not on file    Non-medical: Not on file  Tobacco Use  . Smoking status: Former Smoker    Last attempt to quit: 08/27/1968    Years since quitting: 49.7  . Smokeless tobacco: Never Used  Substance and Sexual Activity  . Alcohol use: Yes    Alcohol/week: 6.0 standard drinks    Types: 6 Glasses of wine per week    Comment:  Regular-wine 2-3 every other day  . Drug use: No  . Sexual activity: Never  Lifestyle  . Physical activity:    Days per week: Not on file    Minutes per session: Not on file  . Stress: Not on file  Relationships  . Social connections:    Talks on phone: Not on file    Gets together: Not on file    Attends religious service: Not on file    Active member of club or organization: Not on file    Attends meetings of clubs or organizations: Not on file    Relationship status: Not on file  Other Topics Concern  . Not on file  Social History Narrative   Lives with husband, [name].   Grown children, 3 sons, 1 daughter   Occupation: retired, was Optician, dispensing   Edu: college   Activity: walks regularly, gym   Diet: good water, vegetables daily    Outpatient Encounter Medications as of 05/27/2018  Medication Sig  . amLODipine (NORVASC) 5 MG tablet TAKE 1 TABLET BY MOUTH EVERY DAY  . NON FORMULARY DoTerra Lifelong Vitality Supplement  . pravastatin (PRAVACHOL) 40 MG tablet Take 1 tablet (40 mg total) by mouth daily.   No facility-administered encounter medications on file as of 05/27/2018.     Activities of Daily Living In your present state of health, do you have any difficulty performing the following activities: 05/27/2018  Hearing? N  Vision? N  Difficulty concentrating or making decisions? N  Walking or climbing stairs? N  Dressing or bathing? N  Doing errands, shopping? N  Preparing Food and eating ? N  Using the Toilet? N  In the past six months, have you accidently leaked urine? N  Do you have problems with loss of bowel control? N  Managing your Medications? N  Managing your Finances? N  Housekeeping or managing your Housekeeping? N  Some recent data might be hidden    Patient Care Team: Ria Bush, MD as PCP - General (Family Medicine) Leanor Kail, MD as Consulting Physician (Orthopedic Surgery) Troxler, Adele Schilder as Attending Physician (Podiatry)     Assessment:   This is a routine wellness examination for [facility].   Hearing Screening   125Hz  250Hz  500Hz  1000Hz  2000Hz  3000Hz  4000Hz  6000Hz  8000Hz   Right ear:   40 40 40  40    Left ear:   40 40 40  40    Vision Screening Comments: Vision exam 3-4 years ago  Exercise Activities and Dietary recommendations Current Exercise Habits: Home exercise routine, Type of exercise: walking, Time (Minutes): 25(1-2 miles daily), Frequency (Times/Week): 7, Weekly Exercise (Minutes/Week): 175, Intensity: Moderate, Exercise limited by: None identified  Goals    . Increase physical activity     Starting 05/27/2018, I will continue to walk 1-2 miles daily.        Fall Risk Fall Risk  05/27/2018 02/13/2017 01/03/2016 12/20/2014 12/09/2013  Falls in the past year? No No No No No   Depression Screen PHQ 2/9 Scores 05/27/2018 02/13/2017 01/03/2016 12/20/2014  PHQ - 2 Score 0 0 0 0  PHQ- 9 Score 0 - - -     Cognitive Function MMSE - Mini Mental State Exam 05/27/2018 02/13/2017 01/03/2016  Orientation to time 5 5 5   Orientation to Place 5 5 5   Registration 3 3 3   Attention/ Calculation 0 0 0  Recall 3 3 3   Language- name 2 objects 0 0 0  Language- repeat 1 1 1   Language- follow 3 step command 3 3 3   Language- read & follow direction 0 0 0  Write a sentence 0 0 0  Copy design 0 0 0  Total score 20 20 20        PLEASE NOTE: A Mini-Cog screen was completed. Maximum score is 20. A value of 0 denotes this part of [place] MMSE was not completed or the patient failed this part of the Mini-Cog screening.   Mini-Cog Screening Orientation to Time - Max 5 pts Orientation to Place - Max 5 pts Registration - Max 3 pts Recall - Max 3 pts Language Repeat - Max 1 pts Language Follow 3 Step Command - Max 3 pts   Immunization History  Administered Date(s) Administered  . Influenza,inj,Quad PF,6+ Mos 05/15/2013, 06/07/2014, 05/11/2015  . Pneumococcal Conjugate-13 12/09/2013  . Pneumococcal Polysaccharide-23  06/08/2004, 05/07/2012  . Td 12/04/2012  . Zoster 12/04/2012    Screening Tests Health Maintenance  Topic Date Due  . INFLUENZA VACCINE  11/26/2018 (Originally 03/27/2018)  . DTaP/Tdap/Td (1 - Tdap) 12/05/2022 (Originally 12/05/2012)  . COLONOSCOPY  01/02/2019  . MAMMOGRAM  01/18/2019  . TETANUS/TDAP  12/05/2022  . DEXA SCAN  Completed  . Hepatitis C Screening  Completed  . PNA vac Low Risk Adult  Completed      Plan:     I have personally reviewed, addressed, and noted the following in the patient's chart:  A. Medical and social history B. Use of alcohol, tobacco or illicit drugs  C. Current medications and supplements D. Functional ability and status E.  Nutritional status F.  Physical activity G. Advance directives H. List of other physicians I.  Hospitalizations, surgeries, and ER visits in previous 12 months J.  [place] to include hearing, vision, cognitive, depression L. Referrals and appointments - none  In addition, I have reviewed and discussed with patient certain preventive protocols, quality metrics, and best practice recommendations. A written personalized care plan for preventive services as well as general preventive health recommendations were provided to patient.  See attached scanned questionnaire for additional information.   Signed,   Lindell Noe, MHA, BS, LPN Health Coach

## 2018-05-30 ENCOUNTER — Ambulatory Visit: Payer: Medicare Other

## 2018-06-05 ENCOUNTER — Ambulatory Visit: Payer: Medicare Other

## 2018-06-14 ENCOUNTER — Encounter: Payer: Self-pay | Admitting: Family Medicine

## 2018-06-29 ENCOUNTER — Other Ambulatory Visit: Payer: Self-pay | Admitting: Family Medicine

## 2018-06-29 NOTE — Progress Notes (Signed)
I reviewed health advisor's note, was available for consultation, and agree with documentation and plan.  

## 2018-07-01 ENCOUNTER — Other Ambulatory Visit: Payer: Self-pay | Admitting: Family Medicine

## 2018-07-02 NOTE — Telephone Encounter (Signed)
Pt last seen annual on 05/27/18; refilled pravastatin per protocol # 90 x 3.[facility]

## 2019-01-22 ENCOUNTER — Encounter: Payer: Self-pay | Admitting: Gastroenterology

## 2019-02-02 ENCOUNTER — Encounter: Payer: Self-pay | Admitting: Gastroenterology

## 2019-02-02 ENCOUNTER — Other Ambulatory Visit: Payer: Self-pay | Admitting: Family Medicine

## 2019-02-02 DIAGNOSIS — Z1231 Encounter for screening mammogram for malignant neoplasm of breast: Secondary | ICD-10-CM

## 2019-03-05 ENCOUNTER — Other Ambulatory Visit: Payer: Self-pay

## 2019-03-05 ENCOUNTER — Ambulatory Visit (AMBULATORY_SURGERY_CENTER): Payer: Self-pay | Admitting: *Deleted

## 2019-03-05 VITALS — Ht 64.0 in | Wt 192.0 lb

## 2019-03-05 DIAGNOSIS — Z86010 Personal history of colonic polyps: Secondary | ICD-10-CM

## 2019-03-05 MED ORDER — NA SULFATE-K SULFATE-MG SULF 17.5-3.13-1.6 GM/177ML PO SOLN: ORAL | 0 refills | Status: DC

## 2019-03-05 NOTE — Progress Notes (Signed)
Patient's pre-visit was done today over the phone with the patient due to COVID-19 pandemic. Name,DOB and address verified. Insurance verified. Packet of Prep instructions mailed to patient including copy of a consent form and pre-procedure patient acknowledgement form-pt is aware. Patient understands to call us back with any questions or concerns. Patient denies any allergies to eggs or soy. Patient denies any problems with anesthesia/sedation. Patient denies any oxygen use at home. Patient denies taking any diet/weight loss medications or blood thinners. EMMI education declined by the patient. Pt is aware that care partner will wait in the car during proceudre; if they feel like they will be too hot to wait in the car; they may wait in the lobby.  We want them to wear a mask (we do not have any that we can provide them), practice social distancing, and we will check their temperatures when they get here.  I did remind patient that their care partner needs to stay in the parking lot the entire time. Pt will wear mask into building.

## 2019-03-19 ENCOUNTER — Encounter: Payer: Medicare Other | Admitting: Gastroenterology

## 2019-03-20 ENCOUNTER — Ambulatory Visit
Admission: RE | Admit: 2019-03-20 | Discharge: 2019-03-20 | Disposition: A | Discharge status: Home / Self Care | Payer: Medicare Other | Source: Ambulatory Visit | Attending: Family Medicine | Admitting: Family Medicine

## 2019-03-20 ENCOUNTER — Other Ambulatory Visit: Payer: Self-pay

## 2019-03-20 DIAGNOSIS — Z1231 Encounter for screening mammogram for malignant neoplasm of breast: Secondary | ICD-10-CM

## 2019-03-20 LAB — HM MAMMOGRAPHY

## 2019-03-23 ENCOUNTER — Encounter: Payer: Self-pay | Admitting: Family Medicine

## 2019-04-05 ENCOUNTER — Other Ambulatory Visit: Payer: Self-pay | Admitting: Family Medicine

## 2019-05-06 ENCOUNTER — Encounter: Payer: Self-pay | Admitting: Family Medicine

## 2019-05-11 ENCOUNTER — Encounter: Payer: Self-pay | Admitting: Family Medicine

## 2019-05-21 ENCOUNTER — Other Ambulatory Visit: Payer: Self-pay | Admitting: Family Medicine

## 2019-05-21 ENCOUNTER — Encounter: Payer: Self-pay | Admitting: Family Medicine

## 2019-05-21 DIAGNOSIS — I10 Essential (primary) hypertension: Secondary | ICD-10-CM

## 2019-05-21 DIAGNOSIS — E785 Hyperlipidemia, unspecified: Secondary | ICD-10-CM

## 2019-05-22 NOTE — Telephone Encounter (Signed)
Pt wants to keep lab appointment but cancel physical with me next week. plz cancel physical. She will contact us to reschedule.

## 2019-05-25 ENCOUNTER — Other Ambulatory Visit: Payer: Medicare Other

## 2019-05-25 NOTE — Telephone Encounter (Signed)
Pt is scheduled for 05/26/19 @ 8:20am for labs. When we originally spoke she rescheduled everything for December, so do she still need to keep the labs in December if she is having them now? Please advise.

## 2019-05-26 ENCOUNTER — Other Ambulatory Visit: Payer: Self-pay

## 2019-05-26 ENCOUNTER — Other Ambulatory Visit (INDEPENDENT_AMBULATORY_CARE_PROVIDER_SITE_OTHER): Payer: Medicare Other

## 2019-05-26 DIAGNOSIS — E785 Hyperlipidemia, unspecified: Secondary | ICD-10-CM

## 2019-05-26 DIAGNOSIS — I10 Essential (primary) hypertension: Secondary | ICD-10-CM | POA: Diagnosis not present

## 2019-05-26 LAB — COMPREHENSIVE METABOLIC PANEL
ALT: 15 U/L (ref 0–35)
AST: 16 U/L (ref 0–37)
Albumin: 4.1 g/dL (ref 3.5–5.2)
Alkaline Phosphatase: 73 U/L (ref 39–117)
BUN: 15 mg/dL (ref 6–23)
CO2: 29 mEq/L (ref 19–32)
Calcium: 9.4 mg/dL (ref 8.4–10.5)
Chloride: 103 mEq/L (ref 96–112)
Creatinine, Ser: 0.83 mg/dL (ref 0.40–1.20)
GFR: 67.5 mL/min (ref >60.00)
Glucose, Bld: 99 mg/dL (ref 70–99)
Potassium: 4.2 mEq/L (ref 3.5–5.1)
Sodium: 140 mEq/L (ref 135–145)
Total Bilirubin: 0.6 mg/dL (ref 0.2–1.2)
Total Protein: 6.9 g/dL (ref 6.0–8.3)

## 2019-05-26 LAB — MICROALBUMIN / CREATININE URINE RATIO
Creatinine,U: 111.5 mg/dL
Microalb Creat Ratio: 0.6 mg/g (ref 0.0–30.0)
Microalb, Ur: <0.7 mg/dL (ref 0.0–1.9)

## 2019-05-26 LAB — LIPID PANEL
Cholesterol: 195 mg/dL (ref 0–200)
HDL: 72.6 mg/dL (ref >39.00)
LDL Cholesterol: 94 mg/dL (ref 0–99)
NonHDL: 122.87
Total CHOL/HDL Ratio: 3
Triglycerides: 145 mg/dL (ref 0.0–149.0)
VLDL: 29 mg/dL (ref 0.0–40.0)

## 2019-05-26 NOTE — Telephone Encounter (Signed)
Please reschedule fasting AM lab visit over next few weeks.  Doesn't need labs when she returns in December.

## 2019-05-27 NOTE — Telephone Encounter (Signed)
She had her labs on yesterday. I will cancel the lab appointment for December.

## 2019-05-29 ENCOUNTER — Encounter: Payer: Medicare Other | Admitting: Family Medicine

## 2019-05-29 ENCOUNTER — Ambulatory Visit: Payer: Medicare Other

## 2019-06-05 ENCOUNTER — Encounter: Payer: Self-pay | Admitting: Family Medicine

## 2019-06-16 ENCOUNTER — Encounter: Payer: Self-pay | Admitting: Family Medicine

## 2019-06-17 NOTE — Telephone Encounter (Signed)
Pt's husband recently passed away, she is requesting medical records for VA benefits from the last few months.  How can she go about getting those records? Husband Victoria Morrison DOB [birth year]/13/1943 Thanks

## 2019-07-02 ENCOUNTER — Other Ambulatory Visit: Payer: Self-pay | Admitting: Family Medicine

## 2019-07-22 ENCOUNTER — Other Ambulatory Visit: Payer: Self-pay

## 2019-07-24 ENCOUNTER — Encounter: Payer: Self-pay | Admitting: Family Medicine

## 2019-07-25 NOTE — Telephone Encounter (Signed)
Please schedule patient for lab visit for Tuesday.

## 2019-07-27 NOTE — Telephone Encounter (Signed)
Spoke with pt scheduling lab visit on 07/28/19 at 9:20.

## 2019-07-28 ENCOUNTER — Other Ambulatory Visit: Payer: Self-pay | Admitting: Family Medicine

## 2019-07-28 ENCOUNTER — Ambulatory Visit (INDEPENDENT_AMBULATORY_CARE_PROVIDER_SITE_OTHER): Payer: Medicare Other

## 2019-07-28 ENCOUNTER — Other Ambulatory Visit: Payer: Medicare Other

## 2019-07-28 ENCOUNTER — Other Ambulatory Visit: Payer: Self-pay

## 2019-07-28 VITALS — Wt 194.0 lb

## 2019-07-28 DIAGNOSIS — E785 Hyperlipidemia, unspecified: Secondary | ICD-10-CM

## 2019-07-28 DIAGNOSIS — Z0000 Encounter for general adult medical examination without abnormal findings: Secondary | ICD-10-CM | POA: Diagnosis not present

## 2019-07-28 DIAGNOSIS — I10 Essential (primary) hypertension: Secondary | ICD-10-CM

## 2019-07-28 NOTE — Patient Instructions (Signed)
Victoria Morrison , Thank you for taking time to come for your Medicare Wellness Visit. I appreciate your ongoing commitment to your health goals. Please review the following plan we discussed and let me know if I can assist you in the future.   Screening recommendations/referrals: Colonoscopy: declined Mammogram: Up to date, completed 03/20/2019 Bone Density: Up to date, completed 01/01/2013 Recommended yearly ophthalmology/optometry visit for glaucoma screening and checkup Recommended yearly dental visit for hygiene and checkup  Vaccinations: Influenza vaccine: declined Pneumococcal vaccine: Completed series Tdap vaccine: Up to date, completed 12/04/2012 Shingles vaccine: declined    Advanced directives: copy in chart  Conditions/risks identified: hypertension, hyperlipidemia  Next appointment: 08/04/2019 @ 11:30 am    Preventive Care 65 Years and Older, Female Preventive care refers to lifestyle choices and visits with your health care provider that can promote health and wellness. What does preventive care include?  A yearly physical exam. This is also called an annual well check.  Dental exams once or twice a year.  Routine eye exams. Ask your health care provider how often you should have your eyes checked.  Personal lifestyle choices, including:  Daily care of your teeth and gums.  Regular physical activity.  Eating a healthy diet.  Avoiding tobacco and drug use.  Limiting alcohol use.  Practicing safe sex.  Taking low-dose aspirin every day.  Taking vitamin and mineral supplements as recommended by your health care provider. What happens during an annual well check? The services and screenings done by your health care provider during your annual well check will depend on your age, overall health, lifestyle risk factors, and family history of disease. Counseling  Your health care provider may ask you questions about your:  Alcohol use.  Tobacco use.  Drug use.   Emotional well-being.  Home and relationship well-being.  Sexual activity.  Eating habits.  History of falls.  Memory and ability to understand (cognition).  Work and work Statistician.  Reproductive health. Screening  You may have the following tests or measurements:  Height, weight, and BMI.  Blood pressure.  Lipid and cholesterol levels. These may be checked every 5 years, or more frequently if you are over 72 years old.  Skin check.  Lung cancer screening. You may have this screening every year starting at age 31 if you have a 30-pack-year history of smoking and currently smoke or have quit within the past 15 years.  Fecal occult blood test (FOBT) of the stool. You may have this test every year starting at age 14.  Flexible sigmoidoscopy or colonoscopy. You may have a sigmoidoscopy every 5 years or a colonoscopy every 10 years starting at age 12.  Hepatitis C blood test.  Hepatitis B blood test.  Sexually transmitted disease (STD) testing.  Diabetes screening. This is done by checking your blood sugar (glucose) after you have not eaten for a while (fasting). You may have this done every 1-3 years.  Bone density scan. This is done to screen for osteoporosis. You may have this done starting at age 22.  Mammogram. This may be done every 1-2 years. Talk to your health care provider about how often you should have regular mammograms. Talk with your health care provider about your test results, treatment options, and if necessary, the need for more tests. Vaccines  Your health care provider may recommend certain vaccines, such as:  Influenza vaccine. This is recommended every year.  Tetanus, diphtheria, and acellular pertussis (Tdap, Td) vaccine. You may need a Td booster  every 10 years.  Zoster vaccine. You may need this after age 15.  Pneumococcal 13-valent conjugate (PCV13) vaccine. One dose is recommended after age 52.  Pneumococcal polysaccharide (PPSV23)  vaccine. One dose is recommended after age 88. Talk to your health care provider about which screenings and vaccines you need and how often you need them. This information is not intended to replace advice given to you by your health care provider. Make sure you discuss any questions you have with your health care provider. Document Released: 09/09/2015 Document Revised: 05/02/2016 Document Reviewed: 06/14/2015 Elsevier Interactive Patient Education  2017 [place] Prevention in the Home Falls can cause injuries. They can happen to people of all ages. There are many things you can do to make your home safe and to help prevent falls. What can I do on the outside of my home?  Regularly fix the edges of walkways and driveways and fix any cracks.  Remove anything that might make you trip as you walk through a door, such as a raised step or threshold.  Trim any bushes or trees on the path to your home.  Use bright outdoor lighting.  Clear any walking paths of anything that might make someone trip, such as rocks or tools.  Regularly check to see if handrails are loose or broken. Make sure that both sides of any steps have handrails.  Any raised decks and porches should have guardrails on the edges.  Have any leaves, snow, or ice cleared regularly.  Use sand or salt on walking paths during winter.  Clean up any spills in your garage right away. This includes oil or grease spills. What can I do in the bathroom?  Use night lights.  Install grab bars by the toilet and in the tub and shower. Do not use towel bars as grab bars.  Use non-skid mats or decals in the tub or shower.  If you need to sit down in the shower, use a plastic, non-slip stool.  Keep the floor dry. Clean up any water that spills on the floor as soon as it happens.  Remove soap buildup in the tub or shower regularly.  Attach bath mats securely with double-sided non-slip rug tape.  Do not have throw rugs  and other things on the floor that can make you trip. What can I do in the bedroom?  Use night lights.  Make sure that you have a light by your bed that is easy to reach.  Do not use any sheets or blankets that are too big for your bed. They should not hang down onto the floor.  Have a firm chair that has side arms. You can use this for support while you get dressed.  Do not have throw rugs and other things on the floor that can make you trip. What can I do in the kitchen?  Clean up any spills right away.  Avoid walking on wet floors.  Keep items that you use a lot in easy-to-reach places.  If you need to reach something above you, use a strong step stool that has a grab bar.  Keep electrical cords out of the way.  Do not use floor polish or wax that makes floors slippery. If you must use wax, use non-skid floor wax.  Do not have throw rugs and other things on the floor that can make you trip. What can I do with my stairs?  Do not leave any items on the stairs.  Make  sure that there are handrails on both sides of the stairs and use them. Fix handrails that are broken or loose. Make sure that handrails are as long as the stairways.  Check any carpeting to make sure that it is firmly attached to the stairs. Fix any carpet that is loose or worn.  Avoid having throw rugs at the top or bottom of the stairs. If you do have throw rugs, attach them to the floor with carpet tape.  Make sure that you have a light switch at the top of the stairs and the bottom of the stairs. If you do not have them, ask someone to add them for you. What else can I do to help prevent falls?  Wear shoes that:  Do not have high heels.  Have rubber bottoms.  Are comfortable and fit you well.  Are closed at the toe. Do not wear sandals.  If you use a stepladder:  Make sure that it is fully opened. Do not climb a closed stepladder.  Make sure that both sides of the stepladder are locked into  place.  Ask someone to hold it for you, if possible.  Clearly mark and make sure that you can see:  Any grab bars or handrails.  First and last steps.  Where the edge of each step is.  Use tools that help you move around (mobility aids) if they are needed. These include:  Canes.  Walkers.  Scooters.  Crutches.  Turn on the lights when you go into a dark area. Replace any light bulbs as soon as they burn out.  Set up your furniture so you have a clear path. Avoid moving your furniture around.  If any of your floors are uneven, fix them.  If there are any pets around you, be aware of where they are.  Review your medicines with your doctor. Some medicines can make you feel dizzy. This can increase your chance of falling. Ask your doctor what other things that you can do to help prevent falls. This information is not intended to replace advice given to you by your health care provider. Make sure you discuss any questions you have with your health care provider. Document Released: 06/09/2009 Document Revised: 01/19/2016 Document Reviewed: 09/17/2014 Elsevier Interactive Patient Education  2017 [employer].

## 2019-07-28 NOTE — Telephone Encounter (Signed)
Actually, reviewing chart she already had her fasting labs in September. No need for lab visit today.

## 2019-07-28 NOTE — Progress Notes (Signed)
PCP notes:  Health Maintenance: Declined colonoscopy, flu vaccine and shingrix   Abnormal Screenings: none   Patient concerns:  none  Nurse concerns: None12/8/   Next PCP appt.: 2020 @ 11:30 am

## 2019-07-28 NOTE — Progress Notes (Signed)
Subjective:   Victoria Morrison is a 73 y.o. female who presents for Medicare Annual (Subsequent) preventive examination.  Review of Systems: N/A   This visit is being conducted through telemedicine via telephone at the nurse health advisor's home address due to the COVID-19 pandemic. This patient has given me verbal consent via doximity to conduct this visit, patient states they are participating from their home address. Patient and myself are on the telephone call. There is no referral for this visit. Some vital signs may be absent or patient reported.    Patient identification: identified by name, DOB, and current address   Cardiac Risk Factors include: advanced age (>72men, >43 women);dyslipidemia;hypertension     Objective:     Vitals: Wt 194 lb (88 kg)   BMI 33.30 kg/m   Body mass index is 33.3 kg/m.  Advanced Directives 07/28/2019 05/27/2018 02/13/2017 01/03/2016  Does Patient Have a Medical Advance Directive? Yes Yes Yes Yes  Type of Paramedic of [place];Living will [place];Living will [place];Living will Living will  Does patient want to make changes to medical advance directive? - - - No - Patient declined  Copy of [place] in Chart? Yes - validated most recent copy scanned in chart (See row information) No - copy requested Yes Yes    Tobacco Social History   Tobacco Use  Smoking Status Former Smoker  . Quit date: 08/27/1968  . Years since quitting: 50.9  Smokeless Tobacco Never Used     Counseling given: Not Answered   Clinical Intake:  Pre-visit preparation completed: Yes  Pain : No/denies pain     Nutritional Risks: None Diabetes: No  How often do you need to have someone help you when you read instructions, pamphlets, or other written materials from your doctor or pharmacy?: 1 - Never What is the last grade level you completed in school?: college graduate  Interpreter  Needed?: No  Information entered by :: CJohnson, LPN  Past Medical History:  Diagnosis Date  . Allergy    SEASONAL  . Asymptomatic bacteriuria   . Cancer ([place])    SKIN  . Diverticulosis   . Environmental allergies    dust; pet dander; pollen  . History of asthma 1980s  . History of MRSA infection 2005   abdomen  . History of pneumonia 1980s  . History of rheumatoid arthritis 2009   in remission, off MTX since 2005, seronegative  . HLD (hyperlipidemia)   . HTN (hypertension)   . Hydradenitis 2005  . Osteoarthritis 2009   bilateral hands/knees s/p synvisc injections x3 ([facility])  . Osteopenia 12/2012   dexa with T score -1.9  . Postmenopausal 2000  . Seasonal allergic rhinitis    Past Surgical History:  Procedure Laterality Date  . APPENDECTOMY  1954  . COLONOSCOPY  12/2013   3 TAs, diverticulosis, rpt 5 yrs [facility])  . DEXA  12/2012   T score -1.3 femur, -1.9 radius  . MEDIAL PARTIAL KNEE REPLACEMENT Bilateral 09/2013   Dr Jefm Bryant  . MENISCUS REPAIR  2009   Left  . TONSILLECTOMY     childhood   Family History  Problem Relation Age of Onset  . Cancer Mother 74       breast  . Breast cancer Mother   . Cancer Maternal Grandmother 48       breast (or unsure)  . Breast cancer Maternal Grandmother   . Cancer Brother 61  lung (smoker)  . Alcoholism Father   . CAD Father 26       MI  . Stroke Neg Hx   . Diabetes Neg Hx   . Colon cancer Neg Hx   . Rectal cancer Neg Hx   . Stomach cancer Neg Hx   . Colon polyps Neg Hx    Social History   Socioeconomic History  . Marital status: Widowed    Spouse name: Not on file  . Number of children: Not on file  . Years of education: Not on file  . Highest education level: Not on file  Occupational History  . Not on file  Social Needs  . Financial resource strain: Not hard at all  . Food insecurity    Worry: Never true    Inability: Never true  . Transportation needs    Medical: No    Non-medical: No   Tobacco Use  . Smoking status: Former Smoker    Quit date: 08/27/1968    Years since quitting: 50.9  . Smokeless tobacco: Never Used  Substance and Sexual Activity  . Alcohol use: Yes    Alcohol/week: 6.0 standard drinks    Types: 6 Glasses of wine per week    Comment: Regular-wine 2-3 every other day  . Drug use: No  . Sexual activity: Never  Lifestyle  . Physical activity    Days per week: 0 days    Minutes per session: 0 min  . Stress: Not at all  Relationships  . Social Herbalist on phone: Not on file    Gets together: Not on file    Attends religious service: Not on file    Active member of club or organization: Not on file    Attends meetings of clubs or organizations: Not on file    Relationship status: Not on file  Other Topics Concern  . Not on file  Social History Narrative   Widower - husband [name] passed away 06-27-19 from AML   Goes to [place]   Grown children, 3 sons, 1 daughter   Occupation: retired, was Optician, dispensing   Edu: college   Activity: walks regularly, gym   Diet: good water, vegetables daily    Outpatient Encounter Medications as of 07/28/2019  Medication Sig  . amLODipine (NORVASC) 5 MG tablet TAKE 1 TABLET BY MOUTH EVERY DAY  . Na Sulfate-K Sulfate-Mg Sulf 17.5-3.13-1.6 GM/177ML SOLN Suprep (no substitutions)-TAKE AS DIRECTED.  . NON FORMULARY DoTerra Lifelong Vitality Supplement  . pravastatin (PRAVACHOL) 40 MG tablet TAKE 1 TABLET DAILY   No facility-administered encounter medications on file as of 07/28/2019.     Activities of Daily Living In your present state of health, do you have any difficulty performing the following activities: 07/28/2019  Hearing? Y  Comment slight hearing loss  Vision? N  Difficulty concentrating or making decisions? N  Walking or climbing stairs? N  Dressing or bathing? N  Doing errands, shopping? N  Preparing Food and eating ? N  Using the Toilet? N  In the past six months, have you accidently  leaked urine? N  Do you have problems with loss of bowel control? N  Managing your Medications? N  Managing your Finances? N  Housekeeping or managing your Housekeeping? N  Some recent data might be hidden    Patient Care Team: Ria Bush, MD as PCP - General (Family Medicine) Leanor Kail, MD (Inactive) as Consulting Physician (Orthopedic Surgery) Troxler, Adele Schilder as Attending Physician (  Podiatry)    Assessment:   This is a routine wellness examination for [facility].  Exercise Activities and Dietary recommendations Current Exercise Habits: Structured exercise class, Type of exercise: treadmill, Time (Minutes): 45, Frequency (Times/Week): 3, Weekly Exercise (Minutes/Week): 135, Intensity: Mild, Exercise limited by: None identified  Goals    . Increase physical activity     Starting 05/27/2018, I will continue to walk 1-2 miles daily.     . Patient Stated     07/28/2019, I will try to work on losing some weight.       Fall Risk Fall Risk  07/28/2019 05/27/2018 02/13/2017 01/03/2016 12/20/2014  Falls in the past year? 0 No No No No  Number falls in past yr: 0 - - - -  Injury with Fall? 0 - - - -  Risk for fall due to : Medication side effect - - - -  Follow up Falls evaluation completed;Falls prevention discussed - - - -   Is the patient's home free of loose throw rugs in walkways, pet beds, electrical cords, etc?   yes      Grab bars in the bathroom? no      Handrails on the stairs?   yes      Adequate lighting?   yes  Timed Get Up and Go performed: N/A  Depression Screen PHQ 2/9 Scores 07/28/2019 05/27/2018 02/13/2017 01/03/2016  PHQ - 2 Score 1 0 0 0  PHQ- 9 Score 1 0 - -     Cognitive Function MMSE - Mini Mental State Exam 07/28/2019 05/27/2018 02/13/2017 01/03/2016  Orientation to time 5 5 5 5   Orientation to Place 5 5 5 5   Registration 3 3 3 3   Attention/ Calculation 5 0 0 0  Recall 3 3 3 3   Language- name 2 objects - 0 0 0  Language- repeat 1 1 1 1   Language-  follow 3 step command - 3 3 3   Language- read & follow direction - 0 0 0  Write a sentence - 0 0 0  Copy design - 0 0 0  Total score - 20 20 20   Mini Cog  Mini-Cog screen was completed. Maximum score is 22. A value of 0 denotes this part of the MMSE was not completed or the patient failed this part of the Mini-Cog screening.       Immunization History  Administered Date(s) Administered  . Influenza,inj,Quad PF,6+ Mos 05/15/2013, 06/07/2014, 05/11/2015  . Pneumococcal Conjugate-13 12/09/2013  . Pneumococcal Polysaccharide-23 06/08/2004, 05/07/2012  . Td 12/04/2012  . Zoster 12/04/2012    Qualifies for Shingles Vaccine? Yes  Screening Tests Health Maintenance  Topic Date Due  . INFLUENZA VACCINE  11/25/2019 (Originally 03/28/2019)  . COLONOSCOPY  07/27/2020 (Originally 01/02/2019)  . DTaP/Tdap/Td (1 - Tdap) 12/05/2022 (Originally 01/01/1965)  . MAMMOGRAM  03/19/2020  . TETANUS/TDAP  12/05/2022  . DEXA SCAN  Completed  . Hepatitis C Screening  Completed  . PNA vac Low Risk Adult  Completed    Cancer Screenings: Lung: Low Dose CT Chest recommended if Age 60-80 years, 30 pack-year currently smoking OR have quit w/in 15years. Patient does not qualify. Breast:  Up to date on Mammogram? Yes, completed 03/20/2019   Up to date of Bone Density/Dexa? Yes, completed 01/01/2013 Colorectal: declined  Additional Screenings:  Hepatitis C Screening: 05/11/2015     Plan:    Patient will work on trying to lose some weight.    I have personally reviewed and noted the following in the  patient's chart:   . Medical and social history . Use of alcohol, tobacco or illicit drugs  . Current medications and supplements . Functional ability and status . Nutritional status . Physical activity . Advanced directives . List of other physicians . Hospitalizations, surgeries, and ER visits in previous 12 months . Vitals . Screenings to include cognitive, depression, and falls . Referrals and  appointments  In addition, I have reviewed and discussed with patient certain preventive protocols, quality metrics, and best practice recommendations. A written personalized care plan for preventive services as well as general preventive health recommendations were provided to patient.     Andrez Grime, LPN  [id number]

## 2019-08-04 ENCOUNTER — Encounter: Payer: Self-pay | Admitting: Family Medicine

## 2019-08-04 ENCOUNTER — Encounter: Payer: Self-pay | Admitting: Gastroenterology

## 2019-08-04 ENCOUNTER — Ambulatory Visit (INDEPENDENT_AMBULATORY_CARE_PROVIDER_SITE_OTHER): Payer: Medicare Other | Admitting: Family Medicine

## 2019-08-04 ENCOUNTER — Other Ambulatory Visit: Payer: Self-pay

## 2019-08-04 VITALS — BP 136/68 | HR 75 | Temp 98.0°F | Ht 63.0 in | Wt 199.0 lb

## 2019-08-04 DIAGNOSIS — M8589 Other specified disorders of bone density and structure, multiple sites: Secondary | ICD-10-CM | POA: Diagnosis not present

## 2019-08-04 DIAGNOSIS — M8949 Other hypertrophic osteoarthropathy, multiple sites: Secondary | ICD-10-CM | POA: Diagnosis not present

## 2019-08-04 DIAGNOSIS — E785 Hyperlipidemia, unspecified: Secondary | ICD-10-CM

## 2019-08-04 DIAGNOSIS — Z1211 Encounter for screening for malignant neoplasm of colon: Secondary | ICD-10-CM

## 2019-08-04 DIAGNOSIS — I10 Essential (primary) hypertension: Secondary | ICD-10-CM

## 2019-08-04 DIAGNOSIS — M8580 Other specified disorders of bone density and structure, unspecified site: Secondary | ICD-10-CM | POA: Diagnosis not present

## 2019-08-04 DIAGNOSIS — M159 Polyosteoarthritis, unspecified: Secondary | ICD-10-CM

## 2019-08-04 MED ORDER — PRAVASTATIN SODIUM 40 MG PO TABS: 40.0000 mg | ORAL_TABLET | Freq: Every day | ORAL | 3 refills | Status: DC

## 2019-08-04 MED ORDER — AMLODIPINE BESYLATE 5 MG PO TABS: 5.0000 mg | ORAL_TABLET | Freq: Every day | ORAL | 3 refills | Status: DC

## 2019-08-04 NOTE — Patient Instructions (Addendum)
We will refer you for colonoscopy  Next time you call for mammogram, ask to schedule with bone density scan.  You are doing well today Return as needed or in 1 year for next physical.   Health Maintenance After Age 72 After age 67, you are at a higher risk for certain long-term diseases and infections as well as injuries from falls. Falls are a major cause of broken bones and head injuries in people who are older than age 45. Getting regular preventive care can help to keep you healthy and well. Preventive care includes getting regular testing and making lifestyle changes as recommended by your health care provider. Talk with your health care provider about:  Which screenings and tests you should have. A screening is a test that checks for a disease when you have no symptoms.  A diet and exercise plan that is right for you. What should I know about screenings and tests to prevent falls? Screening and testing are the best ways to find a health problem early. Early diagnosis and treatment give you the best chance of managing medical conditions that are common after age 61. Certain conditions and lifestyle choices may make you more likely to have a fall. Your health care provider may recommend:  Regular vision checks. Poor vision and conditions such as cataracts can make you more likely to have a fall. If you wear glasses, make sure to get your prescription updated if your vision changes.  Medicine review. Work with your health care provider to regularly review all of the medicines you are taking, including over-the-counter medicines. Ask your health care provider about any side effects that may make you more likely to have a fall. Tell your health care provider if any medicines that you take make you feel dizzy or sleepy.  Osteoporosis screening. Osteoporosis is a condition that causes the bones to get weaker. This can make the bones weak and cause them to break more easily.  Blood pressure  screening. Blood pressure changes and medicines to control blood pressure can make you feel dizzy.  Strength and balance checks. Your health care provider may recommend certain tests to check your strength and balance while standing, walking, or changing positions.  Foot health exam. Foot pain and numbness, as well as not wearing proper footwear, can make you more likely to have a fall.  Depression screening. You may be more likely to have a fall if you have a fear of falling, feel emotionally low, or feel unable to do activities that you used to do.  Alcohol use screening. Using too much alcohol can affect your balance and may make you more likely to have a fall. What actions can I take to lower my risk of falls? General instructions  Talk with your health care provider about your risks for falling. Tell your health care provider if: ? You fall. Be sure to tell your health care provider about all falls, even ones that seem minor. ? You feel dizzy, sleepy, or off-balance.  Take over-the-counter and prescription medicines only as told by your health care provider. These include any supplements.  Eat a healthy diet and maintain a healthy weight. A healthy diet includes low-fat dairy products, low-fat (lean) meats, and fiber from whole grains, beans, and lots of fruits and vegetables. Home safety  Remove any tripping hazards, such as rugs, cords, and clutter.  Install safety equipment such as grab bars in bathrooms and safety rails on stairs.  Keep rooms and walkways well-lit.  Activity   Follow a regular exercise program to stay fit. This will help you maintain your balance. Ask your health care provider what types of exercise are appropriate for you.  If you need a cane or walker, use it as recommended by your health care provider.  Wear supportive shoes that have nonskid soles. Lifestyle  Do not drink alcohol if your health care provider tells you not to drink.  If you drink  alcohol, limit how much you have: ? 0-1 drink a day for women. ? 0-2 drinks a day for men.  Be aware of how much alcohol is in your drink. In the [place] one drink equals one typical bottle of beer (12 oz), one-half glass of wine (5 oz), or one shot of hard liquor (1 oz).  Do not use any products that contain nicotine or tobacco, such as cigarettes and e-cigarettes. If you need help quitting, ask your health care provider. Summary  Having a healthy lifestyle and getting preventive care can help to protect your health and wellness after age 67.  Screening and testing are the best way to find a health problem early and help you avoid having a fall. Early diagnosis and treatment give you the best chance for managing medical conditions that are more common for people who are older than age 26.  Falls are a major cause of broken bones and head injuries in people who are older than age 48. Take precautions to prevent a fall at home.  Work with your health care provider to learn what changes you can make to improve your health and wellness and to prevent falls. This information is not intended to replace advice given to you by your health care provider. Make sure you discuss any questions you have with your health care provider. Document Released: 06/26/2017 Document Revised: 12/04/2018 Document Reviewed: 06/26/2017 Elsevier Patient Education  2020 [employer].

## 2019-08-04 NOTE — Assessment & Plan Note (Signed)
Chronic, continue pravastatin. Pt attributes improved lipid control to doterra supplement she has been using The 10-year ASCVD risk score Mikey Bussing DC Brooke Bonito., et al., 2013) is: 16.8%   Values used to calculate the score:     Age: 73 years     Sex: Female     Is Non-Hispanic African American: No     Diabetic: No     Tobacco smoker: No     Systolic Blood Pressure: XX123456 mmHg     Is BP treated: Yes     HDL Cholesterol: 72.6 mg/dL     Total Cholesterol: 195 mg/dL

## 2019-08-04 NOTE — Assessment & Plan Note (Addendum)
I have asked to schedule DEXA with next mammogram due 02/2020. She continues DoTerra bone health supplement. Also encouraged continued weight bearing activities

## 2019-08-04 NOTE — Assessment & Plan Note (Signed)
Chronic, stable. Continue current regimen. 

## 2019-08-04 NOTE — Assessment & Plan Note (Signed)
Anticipate R thumb pain from 1st MCP OA. Discussed supplements for joint health.

## 2019-08-04 NOTE — Progress Notes (Signed)
This visit was conducted in person.  BP 136/68 (BP Location: Left Arm, Patient Position: Sitting, Cuff Size: Large)   Pulse 75   Temp 98 F (36.7 C) (Temporal)   Ht 5\' 3"  (1.6 m)   Wt 199 lb (90.3 kg)   SpO2 98%   BMI 35.25 kg/m    CC: CPE Subjective:    Patient ID: Victoria Morrison, female    DOB: [birth year], 73 y.o.   MRN: [id number]  HPI: Victoria Morrison is a 73 y.o. female presenting on 08/04/2019 for Annual Exam (Prt 2. )   Saw health advisor last week for medicare wellness visit. Note reviewed.    No exam data present    Clinical Support from 07/28/2019 in [place] at [facility] Total Score  1      Fall Risk  07/28/2019 05/27/2018 02/13/2017 01/03/2016 12/20/2014  Falls in the past year? 0 No No No No  Number falls in past yr: 0 - - - -  Injury with Fall? 0 - - - -  Risk for fall due to : Medication side effect - - - -  Follow up Falls evaluation completed;Falls prevention discussed - - - -   L handed, noticing some discomfort/soreness at R 1st MCP as well as nodule at IP joint and white discoloration to ulnar thumb nail. She does take Deep Blue Doterra supplement BID for inflammation.   Husband passed away this year 06-23-19 of acute myeloid leukemia - was a very rapid deterioration (within 3 wks of diagnosis).   Has family in [place], [place], and [place] as well as local daughter who lives 1 block away.   Preventative: COLONOSCOPY Date: 12/2013 3 TAs, diverticulosis, rpt 5 yrs [facility] Plan).will re refer [facility] -last 01/2017, normal with absent endocervical zone due to atrophy. Always normal paps in past. Discussed pelvic exam every few years.  Mammogram normal 02/2019 Birads1.Gets yearly due to fmhx. Lung cancer screening - not eligible DEXA Date: 12/2012 T score -1.3 femur, -1.9 radius.Takes bone supplement from [place]. Walks regularly. Will rpt 2021 with mammo Flushot - declined Tetanus - 11/2012  Pneumovax - 05/07/2012, prevnar 11/2013 Zostavax- 11/2012  Shingrix - discussed - declines  Advanced directives -scanned 12/2015. HCPOA is daughter Darrick Penna) then son Joneen Caraway). Living will in chart. Seat belt use discussed Sunscreen use discussed, no changing moles on skin. Non smoker  Alcohol - 1-2 glasses of white wine several nights a week  Dentist Q6 mo  Eye exam yearly - due Bowel - no constipation Bladder - no incontinence G4P4   Widower - husband [name] passed away 06/23/2019 from AML Goes to [place] Grown children, 3 sons, 1 daughter, 10 grandchildren Occupation: retired, was Optician, dispensing Edu: college Activity: walks regularly, gym Diet: good water, vegetables daily - using Hello Fresh.     Relevant past medical, surgical, family and social history reviewed and updated as indicated. Interim medical history since our last visit reviewed. Allergies and medications reviewed and updated. Outpatient Medications Prior to Visit  Medication Sig Dispense Refill  . NON FORMULARY DoTerra Lifelong Vitality Supplement    . amLODipine (NORVASC) 5 MG tablet TAKE 1 TABLET BY MOUTH EVERY DAY 90 tablet 0  . pravastatin (PRAVACHOL) 40 MG tablet TAKE 1 TABLET DAILY 90 tablet 3  . Na Sulfate-K Sulfate-Mg Sulf 17.5-3.13-1.6 GM/177ML SOLN Suprep (no substitutions)-TAKE AS DIRECTED. 354 mL 0   No facility-administered medications prior to visit.      Per HPI unless specifically indicated in ROS section  below Review of Systems Objective:    BP 136/68 (BP Location: Left Arm, Patient Position: Sitting, Cuff Size: Large)   Pulse 75   Temp 98 F (36.7 C) (Temporal)   Ht 5\' 3"  (1.6 m)   Wt 199 lb (90.3 kg)   SpO2 98%   BMI 35.25 kg/m   Wt Readings from Last 3 Encounters:  08/04/19 199 lb (90.3 kg)  07/28/19 194 lb (88 kg)  03/05/19 192 lb (87.1 kg)    Physical Exam Vitals signs and nursing note reviewed.  Constitutional:      General: She is not in acute distress.    Appearance: Normal appearance. She is well-developed. She is obese. She  is not ill-appearing.  HENT:     Head: Normocephalic and atraumatic.     Right Ear: Hearing, tympanic membrane, ear canal and external ear normal.     Left Ear: Hearing, tympanic membrane, ear canal and external ear normal.     Nose: Nose normal.     Mouth/Throat:     Pharynx: Uvula midline.  Eyes:     General: No scleral icterus.    Extraocular Movements: Extraocular movements intact.     Conjunctiva/sclera: Conjunctivae normal.     Pupils: Pupils are equal, round, and reactive to light.  Neck:     Musculoskeletal: Normal range of motion and neck supple.     Vascular: No carotid bruit.  Cardiovascular:     Rate and Rhythm: Normal rate and regular rhythm.     Pulses: Normal pulses.          Radial pulses are 2+ on the right side and 2+ on the left side.     Heart sounds: Normal heart sounds. No murmur.  Pulmonary:     Effort: Pulmonary effort is normal. No respiratory distress.     Breath sounds: Normal breath sounds. No wheezing, rhonchi or rales.  Abdominal:     General: Abdomen is flat. Bowel sounds are normal. There is no distension.     Palpations: Abdomen is soft. There is no mass.     Tenderness: There is no abdominal tenderness. There is no guarding or rebound.     Hernia: No hernia is present.  Musculoskeletal: Normal range of motion.        General: Tenderness present. No swelling.     Comments:  FROM bilateral hands R 1st MCP tender to palpation, small ndule present radial 1st IPJ on right as well as white discoloration of R radial thumb nail  Lymphadenopathy:     Cervical: No cervical adenopathy.  Skin:    General: Skin is warm and dry.     Findings: No rash.  Neurological:     General: No focal deficit present.     Mental Status: She is alert and oriented to person, place, and time.     Comments: CN grossly intact, station and gait intact  Psychiatric:        Mood and Affect: Mood normal.        Behavior: Behavior normal.        Thought Content: Thought  content normal.        Judgment: Judgment normal.       Results for orders placed or performed in visit on 05/26/19  Microalbumin / creatinine urine ratio  Result Value Ref Range   Microalb, Ur <0.7 0.0 - 1.9 mg/dL   Creatinine,U 111.5 mg/dL   Microalb Creat Ratio 0.6 0.0 - 30.0 mg/g  Lipid panel  Result Value Ref Range   Cholesterol 195 0 - 200 mg/dL   Triglycerides 145.0 0.0 - 149.0 mg/dL   HDL 72.60 >39.00 mg/dL   VLDL 29.0 0.0 - 40.0 mg/dL   LDL Cholesterol 94 0 - 99 mg/dL   Total CHOL/HDL Ratio 3    NonHDL 122.87   Comprehensive metabolic panel  Result Value Ref Range   Sodium 140 135 - 145 mEq/L   Potassium 4.2 3.5 - 5.1 mEq/L   Chloride 103 96 - 112 mEq/L   CO2 29 19 - 32 mEq/L   Glucose, Bld 99 70 - 99 mg/dL   BUN 15 6 - 23 mg/dL   Creatinine, Ser 0.83 0.40 - 1.20 mg/dL   Total Bilirubin 0.6 0.2 - 1.2 mg/dL   Alkaline Phosphatase 73 39 - 117 U/L   AST 16 0 - 37 U/L   ALT 15 0 - 35 U/L   Total Protein 6.9 6.0 - 8.3 g/dL   Albumin 4.1 3.5 - 5.2 g/dL   Calcium 9.4 8.4 - 10.5 mg/dL   GFR 67.50 >60.00 mL/min   Assessment & Plan:  This visit occurred during the SARS-CoV-2 public health emergency.  Safety protocols were in place, including screening questions prior to the visit, additional usage of staff PPE, and extensive cleaning of exam room while observing appropriate contact time as indicated for disinfecting solutions.   Problem List Items Addressed This Visit    Osteopenia    I have asked to schedule DEXA with next mammogram due 02/2020. She continues DoTerra bone health supplement. Also encouraged continued weight bearing activities       Relevant Orders   DG Bone Density   Osteoarthritis    Anticipate R thumb pain from 1st MCP OA. Discussed supplements for joint health.      HTN (hypertension)    Chronic, stable. Continue current regimen.       Relevant Medications   pravastatin (PRAVACHOL) 40 MG tablet   amLODipine (NORVASC) 5 MG tablet   HLD  (hyperlipidemia) - Primary    Chronic, continue pravastatin. Pt attributes improved lipid control to doterra supplement she has been using The 10-year ASCVD risk score Mikey Bussing DC Brooke Bonito., et al., 2013) is: 16.8%   Values used to calculate the score:     Age: 48 years     Sex: Female     Is Non-Hispanic African American: No     Diabetic: No     Tobacco smoker: No     Systolic Blood Pressure: XX123456 mmHg     Is BP treated: Yes     HDL Cholesterol: 72.6 mg/dL     Total Cholesterol: 195 mg/dL       Relevant Medications   pravastatin (PRAVACHOL) 40 MG tablet   amLODipine (NORVASC) 5 MG tablet    Other Visit Diagnoses    Special screening for malignant neoplasms, colon       Relevant Orders   Ambulatory referral to Gastroenterology   Other specified disorders of bone density and structure, multiple sites        Relevant Orders   DG Bone Density       Meds ordered this encounter  Medications  . DISCONTD: pravastatin (PRAVACHOL) 40 MG tablet    Sig: Take 1 tablet (40 mg total) by mouth daily.    Dispense:  90 tablet    Refill:  3  . DISCONTD: amLODipine (NORVASC) 5 MG tablet    Sig: Take 1 tablet (5 mg total) by mouth  daily.    Dispense:  90 tablet    Refill:  3  . pravastatin (PRAVACHOL) 40 MG tablet    Sig: Take 1 tablet (40 mg total) by mouth daily.    Dispense:  90 tablet    Refill:  3  . amLODipine (NORVASC) 5 MG tablet    Sig: Take 1 tablet (5 mg total) by mouth daily.    Dispense:  90 tablet    Refill:  3   Orders Placed This Encounter  Procedures  . DG Bone Density    Standing Status:   Future    Standing Expiration Date:   10/04/2020    Scheduling Instructions:     Please schedule on same day she has mammogram 02/2020.    Order Specific Question:   Reason for Exam (SYMPTOM  OR DIAGNOSIS REQUIRED)    Answer:   f/u osteopenia    Order Specific Question:   Preferred imaging location?    Answer:   [facility]  . Ambulatory referral to Gastroenterology    Referral  Priority:   Routine    Referral Type:   Consultation    Referral Reason:   Specialty Services Required    Number of Visits Requested:   1    Patient instructions: We will refer you for colonoscopy  Next time you call for mammogram, ask to schedule with bone density scan.  You are doing well today Return as needed or in 1 year for next physical.   Follow up plan: Return in about 1 year (around 08/03/2020) for medicare wellness visit, follow up visit.  Ria Bush, MD

## 2019-08-05 DIAGNOSIS — B059 Measles without complication: Secondary | ICD-10-CM | POA: Insufficient documentation

## 2019-08-05 DIAGNOSIS — B019 Varicella without complication: Secondary | ICD-10-CM | POA: Insufficient documentation

## 2019-08-05 DIAGNOSIS — B269 Mumps without complication: Secondary | ICD-10-CM | POA: Insufficient documentation

## 2019-08-05 DIAGNOSIS — Z8614 Personal history of Methicillin resistant Staphylococcus aureus infection: Secondary | ICD-10-CM | POA: Insufficient documentation

## 2019-08-05 DIAGNOSIS — Z9109 Other allergy status, other than to drugs and biological substances: Secondary | ICD-10-CM | POA: Insufficient documentation

## 2019-08-17 ENCOUNTER — Ambulatory Visit (AMBULATORY_SURGERY_CENTER): Payer: Medicare Other | Admitting: *Deleted

## 2019-08-17 ENCOUNTER — Other Ambulatory Visit: Payer: Self-pay

## 2019-08-17 VITALS — Ht 63.0 in | Wt 195.0 lb

## 2019-08-17 DIAGNOSIS — Z86010 Personal history of colonic polyps: Secondary | ICD-10-CM

## 2019-08-17 DIAGNOSIS — Z1159 Encounter for screening for other viral diseases: Secondary | ICD-10-CM

## 2019-08-17 MED ORDER — SUPREP BOWEL PREP KIT 17.5-3.13-1.6 GM/177ML PO SOLN: 1.0000 | Freq: Once | ORAL | 0 refills | Status: AC

## 2019-08-17 NOTE — Progress Notes (Signed)
Pt verified name, DOB, address and insurance during PV today. Pt mailed instruction packet to included paper to complete and mail back to [facility] with addressed and stamped envelope, Emmi video, copy of consent form to read and not return, and instructions.. PV completed over the phone. Pt encouraged to call with questions or issues - Pt was told Virtual PV but not stated in Epic notes - PV over the phone- Instructions sent My Chart and mail   Cov test 12-30 WED 1210 pm  No egg or soy allergy known to patient  No issues with past sedation with any surgeries  or procedures, no intubation problems  No diet pills per patient No home 02 use per patient  No blood thinners per patient  Pt denies issues with constipation  No A fib or A flutter  EMMI video sent to pt's e mail   Due to the COVID-19 pandemic we are asking patients to follow these guidelines. Please only bring one care partner. Please be aware that your care partner may wait in the car in the parking lot or if they feel like they will be too hot to wait in the car, they may wait in the lobby on the 4th floor. All care partners are required to wear a mask the entire time (we do not have any that we can provide them), they need to practice social distancing, and we will do a Covid check for all patient's and care partners when you arrive. Also we will check their temperature and your temperature. If the care partner waits in their car they need to stay in the parking lot the entire time and we will call them on their cell phone when the patient is ready for discharge so they can bring the car to the front of the building. Also all patient's will need to wear a mask into building.

## 2019-08-26 ENCOUNTER — Ambulatory Visit (INDEPENDENT_AMBULATORY_CARE_PROVIDER_SITE_OTHER): Payer: Medicare Other

## 2019-08-26 ENCOUNTER — Other Ambulatory Visit: Payer: Self-pay | Admitting: Gastroenterology

## 2019-08-26 DIAGNOSIS — Z1159 Encounter for screening for other viral diseases: Secondary | ICD-10-CM

## 2019-08-27 LAB — SARS CORONAVIRUS 2 (TAT 6-24 HRS): SARS Coronavirus 2: NEGATIVE

## 2019-08-28 HISTORY — PX: COLONOSCOPY: SHX174

## 2019-08-31 ENCOUNTER — Other Ambulatory Visit: Payer: Self-pay

## 2019-08-31 ENCOUNTER — Encounter: Payer: Self-pay | Admitting: Gastroenterology

## 2019-08-31 ENCOUNTER — Ambulatory Visit (AMBULATORY_SURGERY_CENTER): Payer: Medicare Other | Admitting: Gastroenterology

## 2019-08-31 VITALS — BP 139/92 | HR 54 | Temp 97.9°F | Resp 18 | Ht 63.0 in | Wt 195.0 lb

## 2019-08-31 DIAGNOSIS — D124 Benign neoplasm of descending colon: Secondary | ICD-10-CM | POA: Diagnosis not present

## 2019-08-31 DIAGNOSIS — D123 Benign neoplasm of transverse colon: Secondary | ICD-10-CM

## 2019-08-31 DIAGNOSIS — Z86010 Personal history of colonic polyps: Secondary | ICD-10-CM | POA: Diagnosis not present

## 2019-08-31 DIAGNOSIS — D122 Benign neoplasm of ascending colon: Secondary | ICD-10-CM

## 2019-08-31 DIAGNOSIS — Z1211 Encounter for screening for malignant neoplasm of colon: Secondary | ICD-10-CM | POA: Diagnosis not present

## 2019-08-31 MED ORDER — SODIUM CHLORIDE 0.9 % IV SOLN: 500.0000 mL | Freq: Once | INTRAVENOUS | Status: DC

## 2019-08-31 NOTE — Op Note (Addendum)
[facility] Patient Name: Victoria Morrison Procedure Date: 08/31/2019 10:50 AM MRN: [id number] Endoscopist: Ladene Artist , MD Age: 74 Referring MD:  Date of Birth: 03/27/1946 Gender: Female Account #: [id number] Procedure:                Colonoscopy Indications:              Surveillance: Personal history of adenomatous                            polyps and SSA on last colonoscopy 5 years ago Medicines:                Monitored Anesthesia Care Procedure:                Pre-Anesthesia Assessment:                           - Prior to the procedure, a History and Physical                            was performed, and patient medications and                            allergies were reviewed. The patient's tolerance of                            previous anesthesia was also reviewed. The risks                            and benefits of the procedure and the sedation                            options and risks were discussed with the patient.                            All questions were answered, and informed consent                            was obtained. Prior Anticoagulants: The patient has                            taken no previous anticoagulant or antiplatelet                            agents. ASA Grade Assessment: II - A patient with                            mild systemic disease. After reviewing the risks                            and benefits, the patient was deemed in                            satisfactory condition to undergo the procedure.  After obtaining informed consent, the colonoscope                            was passed under direct vision. Throughout the                            procedure, the patient's blood pressure, pulse, and                            oxygen saturations were monitored continuously. The                            adult colonoscope was introduced through the anus                            and advanced to the  the sigmoid colon. The peds                            colonoscope was introduced through the anus and                            advanced to the the sigmoid colon. The adult                            endoscope was introduced through the anus and                            advanced to the the cecum, identified by                            appendiceal orifice and ileocecal valve. The                            ileocecal valve, appendiceal orifice, and rectum                            were photographed. The quality of the bowel                            preparation was good. The patient tolerated the                            procedure well. The colonoscopy was somewhat                            difficult due to restricted mobility of the sigmoid                            colon and significant looping. Successful                            completion of the procedure was aided by  withdrawing the adult colonoscope, replacing with                            the pediatric colonoscope and then replacing with                            the adult endoscope. Successful completion aided by                            changing patient position and external abdominal                            pressure. Scope In: 10:55:13 AM Scope Out: 11:32:50 AM Scope Withdrawal Time: 0 hours 16 minutes 38 seconds  Total Procedure Duration: 0 hours 37 minutes 37 seconds  Findings:                 The perianal and digital rectal examinations were                            normal.                           A 10 mm polyp was found in the transverse colon.                            The polyp was sessile. The polyp was removed with a                            hot snare. Resection and retrieval were complete.                           Two sessile polyps were found in the descending                            colon and ascending colon. The polyps were 7 mm in                             size. These polyps were removed with a cold snare.                            Resection and retrieval were complete.                           Multiple medium-mouthed diverticula were found in                            the left colon. There was narrowing of the colon in                            association with the diverticular opening. There                            was evidence of diverticular spasm. There was  evidence of an impacted diverticulum. There was no                            evidence of diverticular bleeding.                           The exam was otherwise without abnormality on                            direct and retroflexion views. Complications:            No immediate complications. Estimated blood loss:                            None. Estimated Blood Loss:     Estimated blood loss: none. Impression:               - One 10 mm polyp in the transverse colon, removed                            with a hot snare. Resected and retrieved.                           - Two 7 mm polyps in the descending colon and in                            the ascending colon, removed with a cold snare.                            Resected and retrieved.                           - Moderate diverticulosis in the left colon.                           - The examination was otherwise normal on direct                            and retroflexion views. Recommendation:           - Repeat colonoscopy after studies are complete for                            surveillance based on pathology results.                           - Patient has a contact number available for                            emergencies. The signs and symptoms of potential                            delayed complications were discussed with the                            patient. Return to  normal activities tomorrow.                            Written discharge instructions were provided to the                             patient.                           - High fiber diet.                           - Continue present medications.                           - Await pathology results.                           - No aspirin, ibuprofen, naproxen, or other                            non-steroidal anti-inflammatory drugs for 2 weeks                            after polyp removal. Ladene Artist, MD 08/31/2019 11:41:21 AM This report has been signed electronically.

## 2019-08-31 NOTE — Progress Notes (Signed)
Called to room to assist during endoscopic procedure.  Patient ID and intended procedure confirmed with present staff. Received instructions for my participation in the procedure from the performing physician.  

## 2019-08-31 NOTE — Progress Notes (Signed)
Temp by Levora Dredge by DT

## 2019-08-31 NOTE — Progress Notes (Signed)
A and O x3. Report to RN. Tolerated MAC anesthesia well.

## 2019-08-31 NOTE — Patient Instructions (Signed)
No aspirin, ibuprofen, naproxen, or other non-steroidal anti-inflammaroty drugs for 2 weeks after polyp removal.  Handouts given on polyps, diverticulosis, and high-fiber diet.  YOU HAD AN ENDOSCOPIC PROCEDURE TODAY AT [facility]:   Refer to the procedure report that was given to you for any specific questions about what was found during the examination.  If the procedure report does not answer your questions, please call your gastroenterologist to clarify.  If you requested that your care partner not be given the details of your procedure findings, then the procedure report has been included in a sealed envelope for you to review at your convenience later.  YOU SHOULD EXPECT: Some feelings of bloating in the abdomen. Passage of more gas than usual.  Walking can help get rid of the air that was put into your GI tract during the procedure and reduce the bloating. If you had a lower endoscopy (such as a colonoscopy or flexible sigmoidoscopy) you may notice spotting of blood in your stool or on the toilet paper. If you underwent a bowel prep for your procedure, you may not have a normal bowel movement for a few days.  Please Note:  You might notice some irritation and congestion in your nose or some drainage.  This is from the oxygen used during your procedure.  There is no need for concern and it should clear up in a day or so.  SYMPTOMS TO REPORT IMMEDIATELY:   Following lower endoscopy (colonoscopy or flexible sigmoidoscopy):  Excessive amounts of blood in the stool  Significant tenderness or worsening of abdominal pains  Swelling of the abdomen that is new, acute  Fever of 100F or higher  For urgent or emergent issues, a gastroenterologist can be reached at any hour by calling [phone].   DIET:  We do recommend a small meal at first, but then you may proceed to a High-fiber diet.  Drink plenty of fluids but you should avoid alcoholic beverages for 24  hours.  ACTIVITY:  You should plan to take it easy for the rest of today and you should NOT DRIVE or use heavy machinery until tomorrow (because of the sedation medicines used during the test).    FOLLOW UP: Our staff will call the number listed on your records 48-72 hours following your procedure to check on you and address any questions or concerns that you may have regarding the information given to you following your procedure. If we do not reach you, we will leave a message.  We will attempt to reach you two times.  During this call, we will ask if you have developed any symptoms of COVID 19. If you develop any symptoms (ie: fever, flu-like symptoms, shortness of breath, cough etc.) before then, please call [phone].  If you test positive for Covid 19 in the 2 weeks post procedure, please call and report this information to [place].    If any biopsies were taken you will be contacted by phone or by letter within the next 1-3 weeks.  Please call us at [phone] if you have not heard about the biopsies in 3 weeks.    SIGNATURES/CONFIDENTIALITY: You and/or your care partner have signed paperwork which will be entered into your electronic medical record.  These signatures attest to the fact that that the information above on your After Visit Summary has been reviewed and is understood.  Full responsibility of the confidentiality of this discharge information lies with you and/or your care-partner.

## 2019-09-02 ENCOUNTER — Telehealth: Payer: Self-pay

## 2019-09-02 NOTE — Telephone Encounter (Signed)
  Follow up Call-  Call back number 08/31/2019  Post procedure Call Back phone  # [phone]  Permission to leave phone message Yes  Some recent data might be hidden     Left message

## 2019-09-02 NOTE — Telephone Encounter (Signed)
  Follow up Call-  Call back number 08/31/2019  Post procedure Call Back phone  # [phone]  Permission to leave phone message Yes  Some recent data might be hidden     Patient questions:  Do you have a fever, pain , or abdominal swelling? No. Pain Score  0 *  Have you tolerated food without any problems? Yes.    Have you been able to return to your normal activities? Yes.    Do you have any questions about your discharge instructions: Diet   No. Medications  No. Follow up visit  No.  Do you have questions or concerns about your Care? No.  Actions: * If pain score is 4 or above: No action needed, pain <4.  1. Have you developed a fever since your procedure? no  2.   Have you had an respiratory symptoms (SOB or cough) since your procedure? no  3.   Have you tested positive for COVID 19 since your procedure no  4.   Have you had any family members/close contacts diagnosed with the COVID 19 since your procedure?  no   If yes to any of these questions please route to Joylene John, RN and Alphonsa Gin, Therapist, sports.

## 2019-09-04 ENCOUNTER — Encounter: Payer: Self-pay | Admitting: Gastroenterology

## 2019-12-24 DIAGNOSIS — H2513 Age-related nuclear cataract, bilateral: Secondary | ICD-10-CM | POA: Diagnosis not present

## 2020-01-15 ENCOUNTER — Encounter: Payer: Self-pay | Admitting: Family Medicine

## 2020-01-15 DIAGNOSIS — I10 Essential (primary) hypertension: Secondary | ICD-10-CM

## 2020-01-15 DIAGNOSIS — E785 Hyperlipidemia, unspecified: Secondary | ICD-10-CM

## 2020-01-16 ENCOUNTER — Encounter: Payer: Self-pay | Admitting: Family Medicine

## 2020-01-19 ENCOUNTER — Other Ambulatory Visit (INDEPENDENT_AMBULATORY_CARE_PROVIDER_SITE_OTHER): Payer: Medicare Other

## 2020-01-19 DIAGNOSIS — E785 Hyperlipidemia, unspecified: Secondary | ICD-10-CM

## 2020-01-19 LAB — LIPID PANEL
Cholesterol: 204 mg/dL — ABNORMAL HIGH (ref 0–200)
HDL: 62.5 mg/dL (ref >39.00)
NonHDL: 141.52
Total CHOL/HDL Ratio: 3
Triglycerides: 201 mg/dL — ABNORMAL HIGH (ref 0.0–149.0)
VLDL: 40.2 mg/dL — ABNORMAL HIGH (ref 0.0–40.0)

## 2020-01-19 LAB — BASIC METABOLIC PANEL
BUN: 18 mg/dL (ref 6–23)
CO2: 29 mEq/L (ref 19–32)
Calcium: 9 mg/dL (ref 8.4–10.5)
Chloride: 105 mEq/L (ref 96–112)
Creatinine, Ser: 0.73 mg/dL (ref 0.40–1.20)
GFR: 78.14 mL/min (ref >60.00)
Glucose, Bld: 95 mg/dL (ref 70–99)
Potassium: 4.2 mEq/L (ref 3.5–5.1)
Sodium: 140 mEq/L (ref 135–145)

## 2020-01-19 LAB — LDL CHOLESTEROL, DIRECT: Direct LDL: 111 mg/dL

## 2020-02-18 ENCOUNTER — Other Ambulatory Visit: Payer: Self-pay | Admitting: Family Medicine

## 2020-02-18 DIAGNOSIS — Z1231 Encounter for screening mammogram for malignant neoplasm of breast: Secondary | ICD-10-CM

## 2020-02-18 DIAGNOSIS — M8580 Other specified disorders of bone density and structure, unspecified site: Secondary | ICD-10-CM

## 2020-02-18 DIAGNOSIS — M8589 Other specified disorders of bone density and structure, multiple sites: Secondary | ICD-10-CM

## 2020-02-23 ENCOUNTER — Encounter: Payer: Self-pay | Admitting: Family Medicine

## 2020-05-11 ENCOUNTER — Ambulatory Visit
Admission: RE | Admit: 2020-05-11 | Discharge: 2020-05-11 | Disposition: A | Discharge status: Home / Self Care | Payer: Medicare Other | Source: Ambulatory Visit | Attending: Family Medicine | Admitting: Family Medicine

## 2020-05-11 ENCOUNTER — Other Ambulatory Visit: Payer: Self-pay

## 2020-05-11 DIAGNOSIS — M8589 Other specified disorders of bone density and structure, multiple sites: Secondary | ICD-10-CM

## 2020-05-11 DIAGNOSIS — Z1231 Encounter for screening mammogram for malignant neoplasm of breast: Secondary | ICD-10-CM | POA: Diagnosis not present

## 2020-05-11 DIAGNOSIS — M8580 Other specified disorders of bone density and structure, unspecified site: Secondary | ICD-10-CM

## 2020-05-11 DIAGNOSIS — Z780 Asymptomatic menopausal state: Secondary | ICD-10-CM | POA: Diagnosis not present

## 2020-05-13 ENCOUNTER — Encounter: Payer: Self-pay | Admitting: Family Medicine

## 2020-05-13 LAB — HM MAMMOGRAPHY

## 2020-06-27 DIAGNOSIS — U071 COVID-19: Secondary | ICD-10-CM

## 2020-06-27 HISTORY — DX: COVID-19: U07.1

## 2020-07-11 ENCOUNTER — Encounter: Payer: Self-pay | Admitting: Family Medicine

## 2020-07-11 NOTE — Telephone Encounter (Signed)
Plz schedule wellness, cpe and lab visits.

## 2020-07-12 ENCOUNTER — Other Ambulatory Visit: Payer: Self-pay

## 2020-07-12 ENCOUNTER — Encounter: Payer: Self-pay | Admitting: Family Medicine

## 2020-07-12 ENCOUNTER — Telehealth (INDEPENDENT_AMBULATORY_CARE_PROVIDER_SITE_OTHER): Payer: Medicare Other | Admitting: Family Medicine

## 2020-07-12 ENCOUNTER — Other Ambulatory Visit (INDEPENDENT_AMBULATORY_CARE_PROVIDER_SITE_OTHER): Payer: Medicare Other | Admitting: Family Medicine

## 2020-07-12 ENCOUNTER — Other Ambulatory Visit: Payer: Medicare Other

## 2020-07-12 VITALS — Temp 98.1°F | Ht 63.0 in | Wt 184.4 lb

## 2020-07-12 DIAGNOSIS — R52 Pain, unspecified: Secondary | ICD-10-CM

## 2020-07-12 LAB — POC INFLUENZA A&B (BINAX/QUICKVUE)
Influenza A, POC: NEGATIVE
Influenza B, POC: NEGATIVE

## 2020-07-12 NOTE — Assessment & Plan Note (Signed)
Predominantly to hands, diffuse as well, associated with chills and fatigue. In [place] pandemic reasonable to rule this out. I have asked her to come in this afternoon for COVID and flu swabs - ordered. Reassuringly symptom improvement noted over the past 24 hours. Update if not continuing to improve.

## 2020-07-12 NOTE — Progress Notes (Signed)
Patient ID: Victoria Morrison, female    DOB: 05-05-1946, 74 y.o.   MRN: [id number]  Virtual visit completed through [place], a video enabled telemedicine application. Due to national recommendations of social distancing due to COVID-19, a virtual visit is felt to be most appropriate for this patient at this time. Reviewed limitations, risks, security and privacy concerns of performing a virtual visit and the availability of in person appointments. I also reviewed that there may be a patient responsible charge related to this service. The patient agreed to proceed.   Patient location: home Provider location: [place] at [facility], office Persons participating in this virtual visit: patient, provider  If any vitals were documented, they were collected by patient at home unless specified below.    Temp 98.1 F (36.7 C)   Ht 5\' 3"  (1.6 m)   Wt 184 lb 6 oz (83.6 kg)   BMI 32.66 kg/m    CC: body aches Subjective:   HPI: Victoria Morrison is a 74 y.o. female presenting on 07/12/2020 for Generalized Body Aches (C/o body aches, chills, low-grade fever 100, minor cough and fatigue.  Sxs started 07/08/20.  Says body aches and chills have improved. )   Sold house in September 2021, now lives with daughter (lived a block away).   5d h/o chills, body aches, hand stiffness, fatigue. Overnight feeling better with hand stiffness, body aches, but fatigue persists. Yesterday started with slight cough and diarrhea.   Tmax 100.1 this morning.   No significant headache, ear or tooth pain, wheeze, dyspnea, congestion, rhinorrhea, ST. No loss of taste or smell, abd pain, nausea. No tick bites or new rashes.   She may have overdone it at the beginning of the month.  No sick contacts. No known COVID exposure.  She is not vaccinated for COVID.  She has been using doterra oils   No recent trouble with asthma. Non smoker.   10 lb weight loss in the past year - intentional. [facility], started [facility].      Relevant past medical, surgical, family and social history reviewed and updated as indicated. Interim medical history since our last visit reviewed. Allergies and medications reviewed and updated. Outpatient Medications Prior to Visit  Medication Sig Dispense Refill  . amLODipine (NORVASC) 5 MG tablet Take 1 tablet (5 mg total) by mouth daily. 90 tablet 3  . NON FORMULARY DoTerra Lifelong Vitality Supplement    . pravastatin (PRAVACHOL) 40 MG tablet Take 1 tablet (40 mg total) by mouth daily. 90 tablet 3   No facility-administered medications prior to visit.     Per HPI unless specifically indicated in ROS section below Review of Systems Objective:  Temp 98.1 F (36.7 C)   Ht 5\' 3"  (1.6 m)   Wt 184 lb 6 oz (83.6 kg)   BMI 32.66 kg/m   Wt Readings from Last 3 Encounters:  07/12/20 184 lb 6 oz (83.6 kg)  08/31/19 195 lb (88.5 kg)  08/17/19 195 lb (88.5 kg)       Physical exam: Gen: alert, NAD, not ill appearing Pulm: speaks in complete sentences without increased work of breathing Psych: normal mood, normal thought content       Assessment & Plan:   Problem List Items Addressed This Visit    Body aches - Primary    Predominantly to hands, diffuse as well, associated with chills and fatigue. In [place] pandemic reasonable to rule this out. I have  asked her to come in this afternoon for COVID and flu swabs - ordered. Reassuringly symptom improvement noted over the past 24 hours. Update if not continuing to improve.           No orders of the defined types were placed in this encounter.  No orders of the defined types were placed in this encounter.   I discussed the assessment and treatment plan with the patient. The patient was provided an opportunity to ask questions and all were answered. The patient agreed with the plan and demonstrated an understanding of the instructions. The patient was advised to call back or seek an in-person  evaluation if the symptoms worsen or if the condition fails to improve as anticipated.  Follow up plan: Return if symptoms worsen or fail to improve.  Victoria Bush, MD

## 2020-07-13 ENCOUNTER — Encounter: Payer: Self-pay | Admitting: Family Medicine

## 2020-07-13 LAB — NOVEL CORONAVIRUS, NAA: SARS-CoV-2, NAA: DETECTED — AB

## 2020-07-13 LAB — SARS-COV-2, NAA 2 DAY TAT

## 2020-07-13 NOTE — Telephone Encounter (Signed)
Spoke with patient.  First day of symptoms was 07/07/2020 (chills).   rec self isolation at home, wear mask, fluids, rest, vit C, D, zinc. Family is not vaccinated. rec they self quarantine as well for 2 wks from last day of exposure to patient.   Offered mAb infusion.  Called pt info to mAb infusion hotline.   Please place on covid call list and check on her on Friday.

## 2020-07-13 NOTE — Telephone Encounter (Signed)
Patient has called wanting a return call about treatment for being positive for covid. Please call her at [phone]  EM

## 2020-07-14 ENCOUNTER — Ambulatory Visit (HOSPITAL_COMMUNITY)
Admission: RE | Admit: 2020-07-14 | Discharge: 2020-07-14 | Disposition: A | Discharge status: Home / Self Care | Payer: Medicare Other | Source: Ambulatory Visit | Attending: Pulmonary Disease | Admitting: Pulmonary Disease

## 2020-07-14 ENCOUNTER — Other Ambulatory Visit: Payer: Self-pay | Admitting: Physician Assistant

## 2020-07-14 DIAGNOSIS — U071 COVID-19: Secondary | ICD-10-CM | POA: Diagnosis not present

## 2020-07-14 DIAGNOSIS — I10 Essential (primary) hypertension: Secondary | ICD-10-CM

## 2020-07-14 MED ORDER — METHYLPREDNISOLONE SODIUM SUCC 125 MG IJ SOLR: 125.0000 mg | Freq: Once | INTRAMUSCULAR | Status: DC | PRN

## 2020-07-14 MED ORDER — ALBUTEROL SULFATE HFA 108 (90 BASE) MCG/ACT IN AERS: 2.0000 | INHALATION_SPRAY | Freq: Once | RESPIRATORY_TRACT | Status: DC | PRN

## 2020-07-14 MED ORDER — SOTROVIMAB 500 MG/8ML IV SOLN
500.0000 mg | Freq: Once | INTRAVENOUS | Status: AC
  Administered 2020-07-14: 500 mg via INTRAVENOUS

## 2020-07-14 MED ORDER — SODIUM CHLORIDE 0.9 % IV SOLN: INTRAVENOUS | Status: DC | PRN

## 2020-07-14 MED ORDER — DIPHENHYDRAMINE HCL 50 MG/ML IJ SOLN: 50.0000 mg | Freq: Once | INTRAMUSCULAR | Status: DC | PRN

## 2020-07-14 MED ORDER — EPINEPHRINE 0.3 MG/0.3ML IJ SOAJ: 0.3000 mg | Freq: Once | INTRAMUSCULAR | Status: DC | PRN

## 2020-07-14 MED ORDER — FAMOTIDINE IN NACL 20-0.9 MG/50ML-% IV SOLN: 20.0000 mg | Freq: Once | INTRAVENOUS | Status: DC | PRN

## 2020-07-14 NOTE — Progress Notes (Signed)
I connected by phone with Victoria Morrison on 07/14/2020 at 9:21 AM to discuss the potential use of a new treatment for mild to moderate COVID-19 viral infection in non-hospitalized patients.  This patient is a 74 y.o. female that meets the FDA criteria for Emergency Use Authorization of COVID monoclonal antibody casirivimab/imdevimab, bamlanivimab/eteseviamb, or sotrovimab.  Has a (+) direct SARS-CoV-2 viral test result  Has mild or moderate COVID-19   Is NOT hospitalized due to COVID-19  Is within 10 days of symptom onset  Has at least one of the high risk factor(s) for progression to severe COVID-19 and/or hospitalization as defined in EUA.  Specific high risk criteria : Older age (>/= 73 yo), BMI > 25, Cardiovascular disease or hypertension and Other high risk medical condition per CDC:  unvaccinated for COVID   I have spoken and communicated the following to the patient or parent/caregiver regarding COVID monoclonal antibody treatment:  1. FDA has authorized the emergency use for the treatment of mild to moderate COVID-19 in adults and pediatric patients with positive results of direct SARS-CoV-2 viral testing who are 41 years of age and older weighing at least 40 kg, and who are at high risk for progressing to severe COVID-19 and/or hospitalization.  2. The significant known and potential risks and benefits of COVID monoclonal antibody, and the extent to which such potential risks and benefits are unknown.  3. Information on available alternative treatments and the risks and benefits of those alternatives, including clinical trials.  4. Patients treated with COVID monoclonal antibody should continue to self-isolate and use infection control measures (e.g., wear mask, isolate, social distance, avoid sharing personal items, clean and disinfect "high touch" surfaces, and frequent handwashing) according to CDC guidelines.   5. The patient or parent/caregiver has the option to accept or refuse  COVID monoclonal antibody treatment.  After reviewing this information with the patient, the patient has agreed to receive one of the available covid 19 monoclonal antibodies and will be provided an appropriate fact sheet prior to infusion.   [place], [place] 07/14/2020 9:21 AM

## 2020-07-14 NOTE — Progress Notes (Signed)
Patient reviewed Fact Sheet for Patients, Parents, and Caregivers for Emergency Use Authorization (EUA) of Sotrovimab for the Treatment of Coronavirus. Patient also reviewed and is agreeable to the estimated cost of treatment. Patient is agreeable to proceed.   

## 2020-07-14 NOTE — Discharge Instructions (Signed)

## 2020-07-14 NOTE — Progress Notes (Signed)
Diagnosis: COVID-19  Physician: Dr. Patrick Wright  Procedure: Covid Infusion Clinic Med: Sotrovimab infusion - Provided patient with sotrovimab fact sheet for patients, parents, and caregivers prior to infusion.   Complications: No immediate complications noted  Discharge: Discharged home  If after the infusion you have any questions or concerns please call the Advanced Practice Provider at [phone] 

## 2020-07-14 NOTE — Telephone Encounter (Signed)
Noted.  Pt added to Call Log.  

## 2020-07-18 ENCOUNTER — Telehealth: Payer: Self-pay

## 2020-07-18 NOTE — Telephone Encounter (Signed)
Spoke with pt asking for an update.  States she feels a lot better.  No more sxs except fatigue and weakness.  Other than that, she feels good.

## 2020-07-18 NOTE — Telephone Encounter (Signed)
Noted. Thank you. Ok to stop following patient.

## 2020-08-15 ENCOUNTER — Ambulatory Visit: Payer: Medicare Other | Admitting: Family Medicine

## 2020-09-08 DIAGNOSIS — H903 Sensorineural hearing loss, bilateral: Secondary | ICD-10-CM | POA: Diagnosis not present

## 2020-09-08 DIAGNOSIS — H9313 Tinnitus, bilateral: Secondary | ICD-10-CM | POA: Diagnosis not present

## 2020-09-14 ENCOUNTER — Other Ambulatory Visit: Payer: Self-pay | Admitting: Family Medicine

## 2020-09-14 ENCOUNTER — Ambulatory Visit (INDEPENDENT_AMBULATORY_CARE_PROVIDER_SITE_OTHER): Payer: Medicare Other

## 2020-09-14 DIAGNOSIS — Z0000 Encounter for general adult medical examination without abnormal findings: Secondary | ICD-10-CM | POA: Diagnosis not present

## 2020-09-14 DIAGNOSIS — E785 Hyperlipidemia, unspecified: Secondary | ICD-10-CM

## 2020-09-14 DIAGNOSIS — I10 Essential (primary) hypertension: Secondary | ICD-10-CM

## 2020-09-14 NOTE — Progress Notes (Signed)
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

## 2020-09-14 NOTE — Patient Instructions (Signed)
Victoria Morrison , Thank you for taking time to come for your Medicare Wellness Visit. I appreciate your ongoing commitment to your health goals. Please review the following plan we discussed and let me know if I can assist you in the future.   Screening recommendations/referrals: Colonoscopy: Up to date, completed 08/31/2019, due 08/2024 Mammogram: Up to date, completed 05/13/2020, due 04/2021 Bone Density: Up to date, completed 05/11/2020, due 04/2022 Recommended yearly ophthalmology/optometry visit for glaucoma screening and checkup Recommended yearly dental visit for hygiene and checkup  Vaccinations: Influenza vaccine: declined Pneumococcal vaccine: Completed series Tdap vaccine: Up to date, completed 12/04/2012, due 11/2022 Shingles vaccine: due, check with your insurance regarding coverage if interested    Covid-19:declined  Advanced directives: copy in chart  Conditions/risks identified: hypertension, hyperlipidemia   Next appointment: Follow up in one year for your annual wellness visit    Preventive Care 42 Years and Older, Female Preventive care refers to lifestyle choices and visits with your health care provider that can promote health and wellness. What does preventive care include?  A yearly physical exam. This is also called an annual well check.  Dental exams once or twice a year.  Routine eye exams. Ask your health care provider how often you should have your eyes checked.  Personal lifestyle choices, including:  Daily care of your teeth and gums.  Regular physical activity.  Eating a healthy diet.  Avoiding tobacco and drug use.  Limiting alcohol use.  Practicing safe sex.  Taking low-dose aspirin every day.  Taking vitamin and mineral supplements as recommended by your health care provider. What happens during an annual well check? The services and screenings done by your health care provider during your annual well check will depend on your age, overall  health, lifestyle risk factors, and family history of disease. Counseling  Your health care provider may ask you questions about your:  Alcohol use.  Tobacco use.  Drug use.  Emotional well-being.  Home and relationship well-being.  Sexual activity.  Eating habits.  History of falls.  Memory and ability to understand (cognition).  Work and work Statistician.  Reproductive health. Screening  You may have the following tests or measurements:  Height, weight, and BMI.  Blood pressure.  Lipid and cholesterol levels. These may be checked every 5 years, or more frequently if you are over 26 years old.  Skin check.  Lung cancer screening. You may have this screening every year starting at age 36 if you have a 30-pack-year history of smoking and currently smoke or have quit within the past 15 years.  Fecal occult blood test (FOBT) of the stool. You may have this test every year starting at age 63.  Flexible sigmoidoscopy or colonoscopy. You may have a sigmoidoscopy every 5 years or a colonoscopy every 10 years starting at age 35.  Hepatitis C blood test.  Hepatitis B blood test.  Sexually transmitted disease (STD) testing.  Diabetes screening. This is done by checking your blood sugar (glucose) after you have not eaten for a while (fasting). You may have this done every 1-3 years.  Bone density scan. This is done to screen for osteoporosis. You may have this done starting at age 15.  Mammogram. This may be done every 1-2 years. Talk to your health care provider about how often you should have regular mammograms. Talk with your health care provider about your test results, treatment options, and if necessary, the need for more tests. Vaccines  Your health care provider  may recommend certain vaccines, such as:  Influenza vaccine. This is recommended every year.  Tetanus, diphtheria, and acellular pertussis (Tdap, Td) vaccine. You may need a Td booster every 10  years.  Zoster vaccine. You may need this after age 69.  Pneumococcal 13-valent conjugate (PCV13) vaccine. One dose is recommended after age 39.  Pneumococcal polysaccharide (PPSV23) vaccine. One dose is recommended after age 6. Talk to your health care provider about which screenings and vaccines you need and how often you need them. This information is not intended to replace advice given to you by your health care provider. Make sure you discuss any questions you have with your health care provider. Document Released: 09/09/2015 Document Revised: 05/02/2016 Document Reviewed: 06/14/2015 Elsevier Interactive Patient Education  2017 [employer] Prevention in the Home Falls can cause injuries. They can happen to people of all ages. There are many things you can do to make your home safe and to help prevent falls. What can I do on the outside of my home?  Regularly fix the edges of walkways and driveways and fix any cracks.  Remove anything that might make you trip as you walk through a door, such as a raised step or threshold.  Trim any bushes or trees on the path to your home.  Use bright outdoor lighting.  Clear any walking paths of anything that might make someone trip, such as rocks or tools.  Regularly check to see if handrails are loose or broken. Make sure that both sides of any steps have handrails.  Any raised decks and porches should have guardrails on the edges.  Have any leaves, snow, or ice cleared regularly.  Use sand or salt on walking paths during winter.  Clean up any spills in your garage right away. This includes oil or grease spills. What can I do in the bathroom?  Use night lights.  Install grab bars by the toilet and in the tub and shower. Do not use towel bars as grab bars.  Use non-skid mats or decals in the tub or shower.  If you need to sit down in the shower, use a plastic, non-slip stool.  Keep the floor dry. Clean up any water that  spills on the floor as soon as it happens.  Remove soap buildup in the tub or shower regularly.  Attach bath mats securely with double-sided non-slip rug tape.  Do not have throw rugs and other things on the floor that can make you trip. What can I do in the bedroom?  Use night lights.  Make sure that you have a light by your bed that is easy to reach.  Do not use any sheets or blankets that are too big for your bed. They should not hang down onto the floor.  Have a firm chair that has side arms. You can use this for support while you get dressed.  Do not have throw rugs and other things on the floor that can make you trip. What can I do in the kitchen?  Clean up any spills right away.  Avoid walking on wet floors.  Keep items that you use a lot in easy-to-reach places.  If you need to reach something above you, use a strong step stool that has a grab bar.  Keep electrical cords out of the way.  Do not use floor polish or wax that makes floors slippery. If you must use wax, use non-skid floor wax.  Do not have throw rugs  and other things on the floor that can make you trip. What can I do with my stairs?  Do not leave any items on the stairs.  Make sure that there are handrails on both sides of the stairs and use them. Fix handrails that are broken or loose. Make sure that handrails are as long as the stairways.  Check any carpeting to make sure that it is firmly attached to the stairs. Fix any carpet that is loose or worn.  Avoid having throw rugs at the top or bottom of the stairs. If you do have throw rugs, attach them to the floor with carpet tape.  Make sure that you have a light switch at the top of the stairs and the bottom of the stairs. If you do not have them, ask someone to add them for you. What else can I do to help prevent falls?  Wear shoes that:  Do not have high heels.  Have rubber bottoms.  Are comfortable and fit you well.  Are closed at the  toe. Do not wear sandals.  If you use a stepladder:  Make sure that it is fully opened. Do not climb a closed stepladder.  Make sure that both sides of the stepladder are locked into place.  Ask someone to hold it for you, if possible.  Clearly mark and make sure that you can see:  Any grab bars or handrails.  First and last steps.  Where the edge of each step is.  Use tools that help you move around (mobility aids) if they are needed. These include:  Canes.  Walkers.  Scooters.  Crutches.  Turn on the lights when you go into a dark area. Replace any light bulbs as soon as they burn out.  Set up your furniture so you have a clear path. Avoid moving your furniture around.  If any of your floors are uneven, fix them.  If there are any pets around you, be aware of where they are.  Review your medicines with your doctor. Some medicines can make you feel dizzy. This can increase your chance of falling. Ask your doctor what other things that you can do to help prevent falls. This information is not intended to replace advice given to you by your health care provider. Make sure you discuss any questions you have with your health care provider. Document Released: 06/09/2009 Document Revised: 01/19/2016 Document Reviewed: 09/17/2014 Elsevier Interactive Patient Education  2017 [employer].

## 2020-09-14 NOTE — Progress Notes (Signed)
Subjective:   Victoria Morrison is a 75 y.o. female who presents for Medicare Annual (Subsequent) preventive examination.  Review of Systems: N/A      I connected with the patient today by telephone and verified that I am speaking with the correct person using two identifiers. Location patient: home Location nurse: work Persons participating in the telephone visit: patient, nurse.   I discussed the limitations, risks, security and privacy concerns of performing an evaluation and management service by telephone and the availability of in person appointments. I also discussed with the patient that there may be a patient responsible charge related to this service. The patient expressed understanding and verbally consented to this telephonic visit.        Cardiac Risk Factors include: advanced age (>25men, >72 women);hypertension;Other (see comment), Risk factor comments: hyperlipidemia     Objective:    Today's Vitals   There is no height or weight on file to calculate BMI.  Advanced Directives 09/14/2020 07/28/2019 05/27/2018 02/13/2017 01/03/2016  Does Patient Have a Medical Advance Directive? Yes Yes Yes Yes Yes  Type of Advance Directive Living will;Healthcare Power of [place];Living will [place];Living will [place];Living will Living will  Does patient want to make changes to medical advance directive? - - - - No - Patient declined  Copy of [place] in Chart? Yes - validated most recent copy scanned in chart (See row information) Yes - validated most recent copy scanned in chart (See row information) No - copy requested Yes Yes    Current Medications (verified) Outpatient Encounter Medications as of 09/14/2020  Medication Sig  . amLODipine (NORVASC) 5 MG tablet Take 1 tablet (5 mg total) by mouth daily.  . NON FORMULARY DoTerra Lifelong Vitality Supplement  . pravastatin (PRAVACHOL) 40 MG tablet  Take 1 tablet (40 mg total) by mouth daily.   No facility-administered encounter medications on file as of 09/14/2020.    Allergies (verified) Asa [aspirin] and Penicillins   History: Past Medical History:  Diagnosis Date  . Allergy    SEASONAL  . Asthma    hx asthma in the 80's after pneumonia   . Asymptomatic bacteriuria   . Cancer ([place])    SKIN  . Diverticulosis   . Environmental allergies    dust; pet dander; pollen  . History of asthma 1980s  . History of MRSA infection 2005   abdomen  . History of pneumonia 1980s  . History of rheumatoid arthritis 2009   in remission, off MTX since 2005, seronegative  . HLD (hyperlipidemia)   . HTN (hypertension)   . Hx of adenomatous colonic polyps   . Hydradenitis 2005  . Osteoarthritis 2009   bilateral hands/knees s/p synvisc injections x3 ([facility])  . Osteopenia 12/2012   dexa with T score -1.9  . Osteopenia   . Postmenopausal 2000  . Seasonal allergic rhinitis    Past Surgical History:  Procedure Laterality Date  . APPENDECTOMY  1954  . COLONOSCOPY  12/2013   3 TAs, diverticulosis, rpt 5 yrs [facility] Plan)  . COLONOSCOPY  08/2019   3 HP, 1 TA, mod diverticulosis rpt 5 yrs [facility] Plan)  . DEXA  12/2012   T score -1.3 femur, -1.9 radius  . MEDIAL PARTIAL KNEE REPLACEMENT Bilateral 09/2013   Dr Jefm Bryant  . MENISCUS REPAIR  2009   Left  . TONSILLECTOMY     childhood   Family History  Problem Relation Age of  Onset  . Cancer Mother 52       breast  . Breast cancer Mother   . Cancer Maternal Grandmother 48       breast (or unsure)  . Breast cancer Maternal Grandmother   . Cancer Brother 7       lung (smoker)  . Alcoholism Father   . CAD Father 64       MI  . Stroke Neg Hx   . Diabetes Neg Hx   . Colon cancer Neg Hx   . Rectal cancer Neg Hx   . Stomach cancer Neg Hx   . Colon polyps Neg Hx   . Esophageal cancer Neg Hx    Social History   Socioeconomic History  . Marital status: Widowed    Spouse name: Not on  file  . Number of children: Not on file  . Years of education: Not on file  . Highest education level: Not on file  Occupational History  . Not on file  Tobacco Use  . Smoking status: Former Smoker    Quit date: 08/27/1968    Years since quitting: 52.0  . Smokeless tobacco: Never Used  Vaping Use  . Vaping Use: Never used  Substance and Sexual Activity  . Alcohol use: Yes    Alcohol/week: 6.0 standard drinks    Types: 6 Glasses of wine per week    Comment: Regular-wine 2-3 every other day  . Drug use: No  . Sexual activity: Never  Other Topics Concern  . Not on file  Social History Narrative   Widower - husband [name] passed away 2019/06/16 from AML   Goes to [place]   Grown children, 3 sons, 1 daughter, 10 grandchildren   Occupation: retired, was Optician, dispensing   Edu: college   Activity: walks regularly, gym   Diet: good water, vegetables daily   Social Determinants of Radio broadcast assistant Strain: Low Risk   . Difficulty of Paying Living Expenses: Not hard at all  Food Insecurity: No Food Insecurity  . Worried About Charity fundraiser in the Last Year: Never true  . Ran Out of Food in the Last Year: Never true  Transportation Needs: No Transportation Needs  . Lack of Transportation (Medical): No  . Lack of Transportation (Non-Medical): No  Physical Activity: Insufficiently Active  . Days of Exercise per Week: 2 days  . Minutes of Exercise per Session: 30 min  Stress: No Stress Concern Present  . Feeling of Stress : Not at all  Social Connections: Not on file    Tobacco Counseling Counseling given: Not Answered   Clinical Intake:  Pre-visit preparation completed: Yes  Pain : No/denies pain     Nutritional Risks: None Diabetes: No  How often do you need to have someone help you when you read instructions, pamphlets, or other written materials from your doctor or pharmacy?: 1 - Never What is the last grade level you completed in school?: some  college  Diabetic: No Nutrition Risk Assessment:  Has the patient had any N/V/D within the last 2 months?  No  Does the patient have any non-healing wounds?  No  Has the patient had any unintentional weight loss or weight gain?  No   Diabetes:  Is the patient diabetic?  No  If diabetic, was a CBG obtained today?  N/A Did the patient bring in their glucometer from home?  N/A How often do you monitor your CBG's? N/A.   Financial Strains and  Diabetes Management:  Are you having any financial strains with the device, your supplies or your medication? N/A.  Does the patient want to be seen by Chronic Care Management for management of their diabetes?  N/A Would the patient like to be referred to a Nutritionist or for Diabetic Management?  N/A    Interpreter Needed?: No  Information entered by :: CJohnson, LPN   Activities of Daily Living In your present state of health, do you have any difficulty performing the following activities: 09/14/2020  Hearing? N  Vision? N  Difficulty concentrating or making decisions? N  Walking or climbing stairs? N  Dressing or bathing? N  Doing errands, shopping? N  Preparing Food and eating ? N  Using the Toilet? N  In the past six months, have you accidently leaked urine? N  Do you have problems with loss of bowel control? N  Managing your Medications? N  Managing your Finances? N  Housekeeping or managing your Housekeeping? N  Some recent data might be hidden    Patient Care Team: Ria Bush, MD as PCP - General (Family Medicine) Leanor Kail, MD (Inactive) as Consulting Physician (Orthopedic Surgery) Troxler, Adele Schilder as Attending Physician (Podiatry)  Indicate any recent Medical Services you may have received from other than [facility] providers in the past year (date may be approximate).     Assessment:   This is a routine wellness examination for [facility].  Hearing/Vision screen  Hearing Screening   125Hz  250Hz  500Hz   1000Hz  2000Hz  3000Hz  4000Hz  6000Hz  8000Hz   Right ear:           Left ear:           Vision Screening Comments: Patient gets annual eye exams   Dietary issues and exercise activities discussed: Current Exercise Habits: Home exercise routine, Type of exercise: walking, Time (Minutes): 30, Frequency (Times/Week): 2, Weekly Exercise (Minutes/Week): 60, Intensity: Moderate, Exercise limited by: None identified  Goals    . Increase physical activity     Starting 05/27/2018, I will continue to walk 1-2 miles daily.     . Patient Stated     07/28/2019, I will try to work on losing some weight.    . Patient Stated     09/14/2020, I will continue to walk 2 days a week for about 30 minutes.      Depression Screen PHQ 2/9 Scores 09/14/2020 07/28/2019 05/27/2018 02/13/2017 01/03/2016 12/20/2014 12/09/2013  PHQ - 2 Score 0 1 0 0 0 0 0  PHQ- 9 Score 0 1 0 - - - -    Fall Risk Fall Risk  09/14/2020 07/28/2019 07/22/2019 05/27/2018 02/13/2017  Falls in the past year? 0 0 0 No No  Comment - - Emmi Telephone Survey: data to providers prior to load - -  Number falls in past yr: 0 0 - - -  Injury with Fall? 0 0 - - -  Risk for fall due to : No Fall Risks Medication side effect - - -  Follow up Falls evaluation completed;Falls prevention discussed Falls evaluation completed;Falls prevention discussed - - -    FALL RISK PREVENTION PERTAINING TO THE HOME:  Any stairs in or around the home? Yes  If so, are there any without handrails? No  Home free of loose throw rugs in walkways, pet beds, electrical cords, etc? Yes  Adequate lighting in your home to reduce risk of falls? Yes   ASSISTIVE DEVICES UTILIZED TO PREVENT FALLS:  Life alert? No  Use of  a cane, walker or w/c? No  Grab bars in the bathroom? Yes  Shower chair or bench in shower? No  Elevated toilet seat or a handicapped toilet? No   TIMED UP AND GO:  Was the test performed? N/A telephone visit.    Cognitive Function: MMSE - Mini Mental State  Exam 09/14/2020 07/28/2019 05/27/2018 02/13/2017 01/03/2016  Not completed: Refused - - - -  Orientation to time - 5 5 5 5   Orientation to Place - 5 5 5 5   Registration - 3 3 3 3   Attention/ Calculation - 5 0 0 0  Recall - 3 3 3 3   Language- name 2 objects - - 0 0 0  Language- repeat - 1 1 1 1   Language- follow 3 step command - - 3 3 3   Language- read & follow direction - - 0 0 0  Write a sentence - - 0 0 0  Copy design - - 0 0 0  Total score - - 20 20 20   Mini Cog  Mini-Cog screen was completed. Maximum score is 22. A value of 0 denotes this part of the MMSE was not completed or the patient failed this part of the Mini-Cog screening.       Immunizations Immunization History  Administered Date(s) Administered  . Influenza,inj,Quad PF,6+ Mos 05/15/2013, 06/07/2014, 05/11/2015  . Pneumococcal Conjugate-13 12/09/2013  . Pneumococcal Polysaccharide-23 06/08/2004, 05/07/2012  . Td 12/04/2012  . Zoster 12/04/2012    TDAP status: Due, Education has been provided regarding the importance of this vaccine. Advised may receive this vaccine at local pharmacy or Health Dept. Aware to provide a copy of the vaccination record if obtained from local pharmacy or Health Dept. Verbalized acceptance and understanding.  Flu Vaccine status: Declined, Education has been provided regarding the importance of this vaccine but patient still declined. Advised may receive this vaccine at local pharmacy or Health Dept. Aware to provide a copy of the vaccination record if obtained from local pharmacy or Health Dept. Verbalized acceptance and understanding.  Pneumococcal vaccine status: Up to date  Covid-19 vaccine status: Declined, Education has been provided regarding the importance of this vaccine but patient still declined. Advised may receive this vaccine at local pharmacy or Health Dept.or vaccine clinic. Aware to provide a copy of the vaccination record if obtained from local pharmacy or Health Dept.  Verbalized acceptance and understanding.  Qualifies for Shingles Vaccine? Yes   Zostavax completed Yes   Shingrix Completed?: No.    Education has been provided regarding the importance of this vaccine. Patient has been advised to call insurance company to determine out of pocket expense if they have not yet received this vaccine. Advised may also receive vaccine at local pharmacy or Health Dept. Verbalized acceptance and understanding.  Screening Tests Health Maintenance  Topic Date Due  . COVID-19 Vaccine (1) Never done  . INFLUENZA VACCINE  03/27/2020  . MAMMOGRAM  05/13/2021  . TETANUS/TDAP  12/05/2022  . COLONOSCOPY (Pts 45-59yrs Insurance coverage will need to be confirmed)  08/30/2024  . DEXA SCAN  Completed  . Hepatitis C Screening  Completed  . PNA vac Low Risk Adult  Completed    Health Maintenance  Health Maintenance Due  Topic Date Due  . COVID-19 Vaccine (1) Never done  . INFLUENZA VACCINE  03/27/2020    Colorectal cancer screening: Type of screening: Colonoscopy. Completed 08/31/2019. Repeat every 5 years  Mammogram status: Completed 05/13/2020. Repeat every year  Bone Density status: Completed 05/11/2020. Results  reflect: Bone density results: OSTEOPENIA. Repeat every 2 years.  Lung Cancer Screening: (Low Dose CT Chest recommended if Age 3-80 years, 30 pack-year currently smoking OR have quit w/in 15 years.) does not qualify.    Additional Screening:  Hepatitis C Screening: does qualify; Completed 05/11/2015  Vision Screening: Recommended annual ophthalmology exams for early detection of glaucoma and other disorders of the eye. Is the patient up to date with their annual eye exam?  Yes  Who is the provider or what is the name of the office in which the patient attends annual eye exams? [facility] If pt is not established with a provider, would they like to be referred to a provider to establish care? No .   Dental Screening: Recommended annual dental  exams for proper oral hygiene  Community Resource Referral / Chronic Care Management: CRR required this visit?  No   CCM required this visit?  No      Plan:     I have personally reviewed and noted the following in the patient's chart:   . Medical and social history . Use of alcohol, tobacco or illicit drugs  . Current medications and supplements . Functional ability and status . Nutritional status . Physical activity . Advanced directives . List of other physicians . Hospitalizations, surgeries, and ER visits in previous 12 months . Vitals . Screenings to include cognitive, depression, and falls . Referrals and appointments  In addition, I have reviewed and discussed with patient certain preventive protocols, quality metrics, and best practice recommendations. A written personalized care plan for preventive services as well as general preventive health recommendations were provided to patient.   Due to this being a telephonic visit, the after visit summary with patients personalized plan was offered to patient via office or my-chart. Patient preferred to pick up at office at next visit or via mychart.   Andrez Grime, LPN   [id number]

## 2020-09-15 ENCOUNTER — Other Ambulatory Visit (INDEPENDENT_AMBULATORY_CARE_PROVIDER_SITE_OTHER): Payer: Medicare Other

## 2020-09-15 ENCOUNTER — Other Ambulatory Visit: Payer: Self-pay

## 2020-09-15 DIAGNOSIS — I10 Essential (primary) hypertension: Secondary | ICD-10-CM | POA: Diagnosis not present

## 2020-09-15 DIAGNOSIS — E785 Hyperlipidemia, unspecified: Secondary | ICD-10-CM | POA: Diagnosis not present

## 2020-09-15 LAB — COMPREHENSIVE METABOLIC PANEL
ALT: 14 U/L (ref 0–35)
AST: 16 U/L (ref 0–37)
Albumin: 4.4 g/dL (ref 3.5–5.2)
Alkaline Phosphatase: 71 U/L (ref 39–117)
BUN: 14 mg/dL (ref 6–23)
CO2: 29 mEq/L (ref 19–32)
Calcium: 9.7 mg/dL (ref 8.4–10.5)
Chloride: 105 mEq/L (ref 96–112)
Creatinine, Ser: 0.79 mg/dL (ref 0.40–1.20)
GFR: 74.06 mL/min (ref >60.00)
Glucose, Bld: 93 mg/dL (ref 70–99)
Potassium: 4.2 mEq/L (ref 3.5–5.1)
Sodium: 141 mEq/L (ref 135–145)
Total Bilirubin: 0.6 mg/dL (ref 0.2–1.2)
Total Protein: 6.9 g/dL (ref 6.0–8.3)

## 2020-09-15 LAB — LIPID PANEL
Cholesterol: 227 mg/dL — ABNORMAL HIGH (ref 0–200)
HDL: 75.6 mg/dL (ref >39.00)
LDL Cholesterol: 113 mg/dL — ABNORMAL HIGH (ref 0–99)
NonHDL: 151.04
Total CHOL/HDL Ratio: 3
Triglycerides: 189 mg/dL — ABNORMAL HIGH (ref 0.0–149.0)
VLDL: 37.8 mg/dL (ref 0.0–40.0)

## 2020-09-15 LAB — MICROALBUMIN / CREATININE URINE RATIO
Creatinine,U: 108.6 mg/dL
Microalb Creat Ratio: 1.2 mg/g (ref 0.0–30.0)
Microalb, Ur: 1.3 mg/dL (ref 0.0–1.9)

## 2020-09-15 LAB — TSH: TSH: 2.82 u[IU]/mL (ref 0.35–4.50)

## 2020-09-21 ENCOUNTER — Encounter: Payer: Self-pay | Admitting: Family Medicine

## 2020-09-21 ENCOUNTER — Other Ambulatory Visit: Payer: Self-pay

## 2020-09-21 ENCOUNTER — Ambulatory Visit (INDEPENDENT_AMBULATORY_CARE_PROVIDER_SITE_OTHER): Payer: Medicare Other | Admitting: Family Medicine

## 2020-09-21 VITALS — BP 124/76 | HR 99 | Temp 97.5°F | Ht 62.75 in | Wt 182.4 lb

## 2020-09-21 DIAGNOSIS — R590 Localized enlarged lymph nodes: Secondary | ICD-10-CM | POA: Diagnosis not present

## 2020-09-21 DIAGNOSIS — E785 Hyperlipidemia, unspecified: Secondary | ICD-10-CM | POA: Diagnosis not present

## 2020-09-21 DIAGNOSIS — Z7189 Other specified counseling: Secondary | ICD-10-CM | POA: Diagnosis not present

## 2020-09-21 DIAGNOSIS — M8580 Other specified disorders of bone density and structure, unspecified site: Secondary | ICD-10-CM | POA: Diagnosis not present

## 2020-09-21 DIAGNOSIS — I10 Essential (primary) hypertension: Secondary | ICD-10-CM | POA: Diagnosis not present

## 2020-09-21 LAB — CBC WITH DIFFERENTIAL/PLATELET
Basophils Absolute: 0.1 10*3/uL (ref 0.0–0.1)
Basophils Relative: 1.1 % (ref 0.0–3.0)
Eosinophils Absolute: 0.3 10*3/uL (ref 0.0–0.7)
Eosinophils Relative: 4.1 % (ref 0.0–5.0)
HCT: 44.2 % (ref 36.0–46.0)
Hemoglobin: 14.9 g/dL (ref 12.0–15.0)
Lymphocytes Relative: 31.5 % (ref 12.0–46.0)
Lymphs Abs: 2 10*3/uL (ref 0.7–4.0)
MCHC: 33.6 g/dL (ref 30.0–36.0)
MCV: 91.8 fl (ref 78.0–100.0)
Monocytes Absolute: 0.6 10*3/uL (ref 0.1–1.0)
Monocytes Relative: 9.3 % (ref 3.0–12.0)
Neutro Abs: 3.5 10*3/uL (ref 1.4–7.7)
Neutrophils Relative %: 54 % (ref 43.0–77.0)
Platelets: 274 10*3/uL (ref 150.0–400.0)
RBC: 4.82 Mil/uL (ref 3.87–5.11)
RDW: 13.7 % (ref 11.5–15.5)
WBC: 6.5 10*3/uL (ref 4.0–10.5)

## 2020-09-21 LAB — POC URINALSYSI DIPSTICK (AUTOMATED)
Bilirubin, UA: NEGATIVE
Blood, UA: NEGATIVE
Glucose, UA: NEGATIVE
Nitrite, UA: NEGATIVE
Protein, UA: POSITIVE — AB
Spec Grav, UA: >=1.03 — AB (ref 1.010–1.025)
Urobilinogen, UA: 0.2 E.U./dL
pH, UA: 5.5 (ref 5.0–8.0)

## 2020-09-21 NOTE — Assessment & Plan Note (Signed)
Chronic, on pravastatin but deterioration noted. Reviewed diet choices to improve LDL control. Continue pravastatin. The 10-year ASCVD risk score Mikey Bussing DC Brooke Bonito., et al., 2013) is: 16.2%   Values used to calculate the score:     Age: 75 years     Sex: Female     Is Non-Hispanic African American: No     Diabetic: No     Tobacco smoker: No     Systolic Blood Pressure: 655 mmHg     Is BP treated: Yes     HDL Cholesterol: 75.6 mg/dL     Total Cholesterol: 227 mg/dL

## 2020-09-21 NOTE — Assessment & Plan Note (Addendum)
New L supraclavicular LAD. Asxs. COVID infection 06/2020 - ?residual LAD from this. Check CBC and urinalysis r/o hematuria today. If persistent past 1-2 wks, consider further imaging (abdominal imaging for abnormal supraclavicular LAD)  I did ask her to notify me in 1-2 wks with LAD update.

## 2020-09-21 NOTE — Addendum Note (Signed)
Addended by: Brenton Grills on: 3/81/8299 37:16 AM   Modules accepted: Orders

## 2020-09-21 NOTE — Patient Instructions (Addendum)
Work on low cholesterol diet to improve numbers, continue current medicines.  You are doing well today For noted possible lymph node above left clavicle - check blood counts today. Monitor this and if persistent in 1-2 weeks let me know for further imaging study. Urinalysis today.  Return as needed or in 1 year for next wellness visit.   Health Maintenance After Age 74 After age 35, you are at a higher risk for certain long-term diseases and infections as well as injuries from falls. Falls are a major cause of broken bones and head injuries in people who are older than age 42. Getting regular preventive care can help to keep you healthy and well. Preventive care includes getting regular testing and making lifestyle changes as recommended by your health care provider. Talk with your health care provider about:  Which screenings and tests you should have. A screening is a test that checks for a disease when you have no symptoms.  A diet and exercise plan that is right for you. What should I know about screenings and tests to prevent falls? Screening and testing are the best ways to find a health problem early. Early diagnosis and treatment give you the best chance of managing medical conditions that are common after age 65. Certain conditions and lifestyle choices may make you more likely to have a fall. Your health care provider may recommend:  Regular vision checks. Poor vision and conditions such as cataracts can make you more likely to have a fall. If you wear glasses, make sure to get your prescription updated if your vision changes.  Medicine review. Work with your health care provider to regularly review all of the medicines you are taking, including over-the-counter medicines. Ask your health care provider about any side effects that may make you more likely to have a fall. Tell your health care provider if any medicines that you take make you feel dizzy or sleepy.  Osteoporosis screening.  Osteoporosis is a condition that causes the bones to get weaker. This can make the bones weak and cause them to break more easily.  Blood pressure screening. Blood pressure changes and medicines to control blood pressure can make you feel dizzy.  Strength and balance checks. Your health care provider may recommend certain tests to check your strength and balance while standing, walking, or changing positions.  Foot health exam. Foot pain and numbness, as well as not wearing proper footwear, can make you more likely to have a fall.  Depression screening. You may be more likely to have a fall if you have a fear of falling, feel emotionally low, or feel unable to do activities that you used to do.  Alcohol use screening. Using too much alcohol can affect your balance and may make you more likely to have a fall. What actions can I take to lower my risk of falls? General instructions  Talk with your health care provider about your risks for falling. Tell your health care provider if: ? You fall. Be sure to tell your health care provider about all falls, even ones that seem minor. ? You feel dizzy, sleepy, or off-balance.  Take over-the-counter and prescription medicines only as told by your health care provider. These include any supplements.  Eat a healthy diet and maintain a healthy weight. A healthy diet includes low-fat dairy products, low-fat (lean) meats, and fiber from whole grains, beans, and lots of fruits and vegetables. Home safety  Remove any tripping hazards, such as rugs, cords,  and clutter.  Install safety equipment such as grab bars in bathrooms and safety rails on stairs.  Keep rooms and walkways well-lit. Activity  Follow a regular exercise program to stay fit. This will help you maintain your balance. Ask your health care provider what types of exercise are appropriate for you.  If you need a cane or walker, use it as recommended by your health care provider.  Wear  supportive shoes that have nonskid soles.   Lifestyle  Do not drink alcohol if your health care provider tells you not to drink.  If you drink alcohol, limit how much you have: ? 0-1 drink a day for women. ? 0-2 drinks a day for men.  Be aware of how much alcohol is in your drink. In the [place] one drink equals one typical bottle of beer (12 oz), one-half glass of wine (5 oz), or one shot of hard liquor (1 oz).  Do not use any products that contain nicotine or tobacco, such as cigarettes and e-cigarettes. If you need help quitting, ask your health care provider. Summary  Having a healthy lifestyle and getting preventive care can help to protect your health and wellness after age 70.  Screening and testing are the best way to find a health problem early and help you avoid having a fall. Early diagnosis and treatment give you the best chance for managing medical conditions that are more common for people who are older than age 36.  Falls are a major cause of broken bones and head injuries in people who are older than age 50. Take precautions to prevent a fall at home.  Work with your health care provider to learn what changes you can make to improve your health and wellness and to prevent falls. This information is not intended to replace advice given to you by your health care provider. Make sure you discuss any questions you have with your health care provider. Document Revised: 12/04/2018 Document Reviewed: 06/26/2017 Elsevier Patient Education  2021 [employer].

## 2020-09-21 NOTE — Assessment & Plan Note (Signed)
Chronic, stable on amlodipine - continue.  

## 2020-09-21 NOTE — Assessment & Plan Note (Signed)
Advanced directives - scanned 12/2015. HCPOA is daughter (Shelly) then son (Russell). Living will in chart.  

## 2020-09-21 NOTE — Assessment & Plan Note (Addendum)
Continue DoTerra bone health supplements with calcium, vit D as well as regular weight bearing exercise. DEXA stable from 2014 to 2021.

## 2020-09-21 NOTE — Progress Notes (Signed)
Patient ID: Victoria Morrison, female    DOB: 15-Mar-1946, 75 y.o.   MRN: [id number]  This visit was conducted in person.  BP 124/76 (BP Location: Left Arm, Patient Position: Sitting, Cuff Size: Normal)   Pulse 99   Temp (!) 97.5 F (36.4 C) (Temporal)   Ht 5' 2.75" (1.594 m)   Wt 182 lb 7 oz (82.8 kg)   SpO2 96%   BMI 32.58 kg/m    CC: CPE Subjective:   HPI: Victoria Morrison is a 75 y.o. female presenting on 09/21/2020 for Annual Exam (Prt 2.  Has BP monitor request form from [employer] to be completed. )   Saw health advisor last week for medicare wellness visit. Note reviewed.   No exam data present  Flowsheet Row Clinical Support from 09/14/2020 in [facility] [place]  PHQ-2 Total Score 0      Fall Risk  09/14/2020 07/28/2019 07/22/2019 05/27/2018 02/13/2017  Falls in the past year? 0 0 0 No No  Comment - - Emmi Telephone Survey: data to providers prior to load - -  Number falls in past yr: 0 0 - - -  Injury with Fall? 0 0 - - -  Risk for fall due to : No Fall Risks Medication side effect - - -  Follow up Falls evaluation completed;Falls prevention discussed Falls evaluation completed;Falls prevention discussed - - -  saw audiologist - overall stable report. Losing high pitch frequency.   L handed, noticing ongoing discomfort/soreness especially at R 1st MCP as well as nodule at 4th IP joint. No active synovitis. She does take Deep Blue Doterra supplement BID for inflammation. Has started walking more regularly.   Husband passed away 08-Jul-2019 of acute myeloid leukemia - was a very rapid deterioration (within 3 wks of diagnosis).   Has family in [place], [place], and [place] as well as local daughter who lives 1 block away - she has moved in with her.   COVID infection 06/2020 - presented with fever/chill, body aches. Symptoms fully resolved.   Regular with her Doterra supplements - taking about 2000+ IU vit D.   Preventative: COLONOSCOPY 08/2019 - 3 HP, 1 TA, mod  diverticulosis rpt 5 yrs Russella Dar) Well woman -last6/2018, normal with absent endocervical zone due to atrophy. Always normal paps in past. Will stop cervical cancer screening.  Mammogram normal 04/2020 Birads1 @ breast center. Gets yearly due to fmhx. Lung cancer screening - noteligible.  DEXA Date: 12/2012 T score -1.3 femur, -1.9 radius.Takes bone supplement from [place]. Walks regulary. DEXA 04/2020: R forearm -1.8, RFN -1.3.  Flushot - declined COVID vaccine - declined Tetanus - 11/2012  Pneumovax - 05/07/2012, prevnar 11/2013 Zostavax- 11/2012 Shingrix - discussed- declines  Advanced directives -scanned 12/2015. HCPOA is daughter Burnett Harry) then son Perlie Gold). Living will in chart. Seat belt use discussed Sunscreen use discussed, no changing moles on skin.  Non smoker  Alcohol - 1-2 glasses ofwhitewine couple nights a week  Dentist Q6 mo  Eye exam yearly  Bowel - no constipation  Bladder - no incontinence  G4P4   Widower - husband [name] passed away 2019-07-08 from AML Goes to [place]  Grown children, 3 sons, 1 daughter, 10 grandchildren Occupation: retired, was Estate manager/land agent Edu: college Activity: walks regularly, gym Diet: good water, vegetables daily      Relevant past medical, surgical, family and social history reviewed and updated as indicated. Interim medical history since our last visit reviewed. Allergies and medications reviewed and updated. Outpatient Medications  Prior to Visit  Medication Sig Dispense Refill  . amLODipine (NORVASC) 5 MG tablet Take 1 tablet (5 mg total) by mouth daily. 90 tablet 3  . NON FORMULARY DoTerra Lifelong Vitality Supplement    . pravastatin (PRAVACHOL) 40 MG tablet Take 1 tablet (40 mg total) by mouth daily. 90 tablet 3   No facility-administered medications prior to visit.     Per HPI unless specifically indicated in ROS section below Review of Systems  Constitutional: Negative for fatigue and fever.  Gastrointestinal:  Negative for abdominal pain, blood in stool, constipation, diarrhea, nausea and vomiting.  Genitourinary: Negative for hematuria.  Musculoskeletal: Negative for back pain.  Skin: Negative for rash.  Hematological: Negative for adenopathy. Does not bruise/bleed easily.   Objective:  BP 124/76 (BP Location: Left Arm, Patient Position: Sitting, Cuff Size: Normal)   Pulse 99   Temp (!) 97.5 F (36.4 C) (Temporal)   Ht 5' 2.75" (1.594 m)   Wt 182 lb 7 oz (82.8 kg)   SpO2 96%   BMI 32.58 kg/m   Wt Readings from Last 3 Encounters:  09/21/20 182 lb 7 oz (82.8 kg)  07/12/20 184 lb 6 oz (83.6 kg)  08/31/19 195 lb (88.5 kg)      Physical Exam Vitals and nursing note reviewed.  Constitutional:      General: She is not in acute distress.    Appearance: Normal appearance. She is well-developed and well-nourished. She is not ill-appearing.  HENT:     Head: Normocephalic and atraumatic.     Right Ear: Hearing, tympanic membrane, ear canal and external ear normal.     Left Ear: Hearing, tympanic membrane, ear canal and external ear normal.     Mouth/Throat:     Mouth: Oropharynx is clear and moist and mucous membranes are normal.     Pharynx: Uvula midline. No posterior oropharyngeal edema.  Eyes:     General: No scleral icterus.    Extraocular Movements: Extraocular movements intact and EOM normal.     Conjunctiva/sclera: Conjunctivae normal.     Pupils: Pupils are equal, round, and reactive to light.  Cardiovascular:     Rate and Rhythm: Normal rate and regular rhythm.     Pulses: Normal pulses and intact distal pulses.          Radial pulses are 2+ on the right side and 2+ on the left side.     Heart sounds: Normal heart sounds. No murmur heard.   Pulmonary:     Effort: Pulmonary effort is normal. No respiratory distress.     Breath sounds: Normal breath sounds. No wheezing, rhonchi or rales.  Chest:  Breasts:     Right: No axillary adenopathy or supraclavicular adenopathy.      Left: Supraclavicular adenopathy present. No axillary adenopathy.    Abdominal:     General: Abdomen is flat. Bowel sounds are normal. There is no distension.     Palpations: Abdomen is soft. There is no mass.     Tenderness: There is no abdominal tenderness. There is no guarding or rebound.     Hernia: No hernia is present.  Musculoskeletal:        General: No edema. Normal range of motion.     Cervical back: Normal range of motion and neck supple.     Right lower leg: No edema.     Left lower leg: No edema.  Lymphadenopathy:     Head:     Right side of head: No  submental, submandibular, tonsillar, preauricular or posterior auricular adenopathy.     Left side of head: No submental, submandibular, tonsillar, preauricular or posterior auricular adenopathy.     Cervical:     Right cervical: No superficial, deep or posterior cervical adenopathy.    Left cervical: No superficial, deep or posterior cervical adenopathy.     Upper Body:     Right upper body: No supraclavicular, axillary or pectoral adenopathy.     Left upper body: Supraclavicular adenopathy present. No axillary or pectoral adenopathy.  Skin:    General: Skin is warm and dry.     Findings: No rash.  Neurological:     General: No focal deficit present.     Mental Status: She is alert and oriented to person, place, and time.     Comments: CN grossly intact, station and gait intact  Psychiatric:        Mood and Affect: Mood and affect and mood normal.        Behavior: Behavior normal.        Thought Content: Thought content normal.        Judgment: Judgment normal.       Results for orders placed or performed in visit on 09/15/20  TSH  Result Value Ref Range   TSH 2.82 0.35 - 4.50 uIU/mL  Microalbumin / creatinine urine ratio  Result Value Ref Range   Microalb, Ur 1.3 0.0 - 1.9 mg/dL   Creatinine,U 108.6 mg/dL   Microalb Creat Ratio 1.2 0.0 - 30.0 mg/g  Comprehensive metabolic panel  Result Value Ref Range    Sodium 141 135 - 145 mEq/L   Potassium 4.2 3.5 - 5.1 mEq/L   Chloride 105 96 - 112 mEq/L   CO2 29 19 - 32 mEq/L   Glucose, Bld 93 70 - 99 mg/dL   BUN 14 6 - 23 mg/dL   Creatinine, Ser 0.79 0.40 - 1.20 mg/dL   Total Bilirubin 0.6 0.2 - 1.2 mg/dL   Alkaline Phosphatase 71 39 - 117 U/L   AST 16 0 - 37 U/L   ALT 14 0 - 35 U/L   Total Protein 6.9 6.0 - 8.3 g/dL   Albumin 4.4 3.5 - 5.2 g/dL   GFR 74.06 >60.00 mL/min   Calcium 9.7 8.4 - 10.5 mg/dL  Lipid panel  Result Value Ref Range   Cholesterol 227 (H) 0 - 200 mg/dL   Triglycerides 189.0 (H) 0.0 - 149.0 mg/dL   HDL 75.60 >39.00 mg/dL   VLDL 37.8 0.0 - 40.0 mg/dL   LDL Cholesterol 113 (H) 0 - 99 mg/dL   Total CHOL/HDL Ratio 3    NonHDL 151.04    Assessment & Plan:  This visit occurred during the SARS-CoV-2 public health emergency.  Safety protocols were in place, including screening questions prior to the visit, additional usage of staff PPE, and extensive cleaning of exam room while observing appropriate contact time as indicated for disinfecting solutions.   Problem List Items Addressed This Visit    Virchow's node    New L supraclavicular LAD. Asxs. COVID infection 06/2020 - ?residual LAD from this. Check CBC and urinalysis r/o hematuria today. If persistent past 1-2 wks, consider further imaging (abdominal imaging for abnormal supraclavicular LAD)  I did ask her to notify me in 1-2 wks with LAD update.       Osteopenia    Continue DoTerra bone health supplements with calcium, vit D as well as regular weight bearing exercise. DEXA stable from 2014  to 2021.       HTN (hypertension)    Chronic, stable on amlodipine - continue.       HLD (hyperlipidemia)    Chronic, on pravastatin but deterioration noted. Reviewed diet choices to improve LDL control. Continue pravastatin. The 10-year ASCVD risk score Mikey Bussing DC Brooke Bonito., et al., 2013) is: 16.2%   Values used to calculate the score:     Age: 47 years     Sex: Female     Is  Non-Hispanic African American: No     Diabetic: No     Tobacco smoker: No     Systolic Blood Pressure: 676 mmHg     Is BP treated: Yes     HDL Cholesterol: 75.6 mg/dL     Total Cholesterol: 227 mg/dL       Advanced care planning/counseling discussion - Primary    Advanced directives -scanned 12/2015. HCPOA is daughter Darrick Penna) then son Joneen Caraway). Living will in chart.          No orders of the defined types were placed in this encounter.  Orders Placed This Encounter  Procedures  . CBC with Differential/Platelet    Patient instructions: Work on low cholesterol diet to improve numbers, continue current medicines.  You are doing well today For noted possible lymph node above left clavicle - check blood counts today. Monitor this and if persistent in 1-2 weeks let me know for further imaging study. Urinalysis today.  Return as needed or in 1 year for next wellness visit.   Follow up plan: Return in about 1 year (around 09/21/2021) for annual exam, prior fasting for blood work.  Ria Bush, MD

## 2020-09-24 ENCOUNTER — Other Ambulatory Visit: Payer: Self-pay | Admitting: Family Medicine

## 2020-09-26 NOTE — Telephone Encounter (Signed)
Pharmacy requests refill on: Pravastatin 40 mg & Amlodipine 5 mg   LAST REFILL: 08/04/2019 (Q-90, R-3) LAST OV: 09/21/2020 NEXT OV: Not Scheduled  PHARMACY: [facility]

## 2020-10-07 ENCOUNTER — Encounter: Payer: Self-pay | Admitting: Family Medicine

## 2020-12-28 DIAGNOSIS — H2513 Age-related nuclear cataract, bilateral: Secondary | ICD-10-CM | POA: Diagnosis not present

## 2021-03-28 ENCOUNTER — Encounter: Payer: Self-pay | Admitting: Family Medicine

## 2021-03-28 DIAGNOSIS — I10 Essential (primary) hypertension: Secondary | ICD-10-CM

## 2021-03-28 DIAGNOSIS — R590 Localized enlarged lymph nodes: Secondary | ICD-10-CM

## 2021-03-28 DIAGNOSIS — M8580 Other specified disorders of bone density and structure, unspecified site: Secondary | ICD-10-CM

## 2021-03-29 ENCOUNTER — Other Ambulatory Visit: Payer: Self-pay | Admitting: Family Medicine

## 2021-03-29 DIAGNOSIS — Z1231 Encounter for screening mammogram for malignant neoplasm of breast: Secondary | ICD-10-CM

## 2021-03-30 ENCOUNTER — Other Ambulatory Visit: Payer: Self-pay | Admitting: Family Medicine

## 2021-03-30 NOTE — Telephone Encounter (Signed)
Can we schedule lab visit for patient for next week? thanks

## 2021-03-31 ENCOUNTER — Other Ambulatory Visit: Payer: Self-pay

## 2021-03-31 ENCOUNTER — Other Ambulatory Visit (INDEPENDENT_AMBULATORY_CARE_PROVIDER_SITE_OTHER): Payer: Medicare Other

## 2021-03-31 DIAGNOSIS — I10 Essential (primary) hypertension: Secondary | ICD-10-CM | POA: Diagnosis not present

## 2021-03-31 LAB — CBC WITH DIFFERENTIAL/PLATELET
Basophils Absolute: 0.1 10*3/uL (ref 0.0–0.1)
Basophils Relative: 0.9 % (ref 0.0–3.0)
Eosinophils Absolute: 0.2 10*3/uL (ref 0.0–0.7)
Eosinophils Relative: 3.9 % (ref 0.0–5.0)
HCT: 43.3 % (ref 36.0–46.0)
Hemoglobin: 14.4 g/dL (ref 12.0–15.0)
Lymphocytes Relative: 30.8 % (ref 12.0–46.0)
Lymphs Abs: 1.9 10*3/uL (ref 0.7–4.0)
MCHC: 33.3 g/dL (ref 30.0–36.0)
MCV: 91.8 fl (ref 78.0–100.0)
Monocytes Absolute: 0.6 10*3/uL (ref 0.1–1.0)
Monocytes Relative: 9.9 % (ref 3.0–12.0)
Neutro Abs: 3.3 10*3/uL (ref 1.4–7.7)
Neutrophils Relative %: 54.5 % (ref 43.0–77.0)
Platelets: 239 10*3/uL (ref 150.0–400.0)
RBC: 4.71 Mil/uL (ref 3.87–5.11)
RDW: 13.1 % (ref 11.5–15.5)
WBC: 6.1 10*3/uL (ref 4.0–10.5)

## 2021-03-31 LAB — BASIC METABOLIC PANEL
BUN: 14 mg/dL (ref 6–23)
CO2: 27 mEq/L (ref 19–32)
Calcium: 9.3 mg/dL (ref 8.4–10.5)
Chloride: 103 mEq/L (ref 96–112)
Creatinine, Ser: 0.8 mg/dL (ref 0.40–1.20)
GFR: 72.67 mL/min (ref >60.00)
Glucose, Bld: 90 mg/dL (ref 70–99)
Potassium: 4 mEq/L (ref 3.5–5.1)
Sodium: 138 mEq/L (ref 135–145)

## 2021-05-02 ENCOUNTER — Encounter: Payer: Self-pay | Admitting: Family Medicine

## 2021-05-18 ENCOUNTER — Other Ambulatory Visit: Payer: Self-pay

## 2021-05-18 ENCOUNTER — Ambulatory Visit
Admission: RE | Admit: 2021-05-18 | Discharge: 2021-05-18 | Disposition: A | Discharge status: Home / Self Care | Payer: Medicare Other | Source: Ambulatory Visit | Attending: Family Medicine | Admitting: Family Medicine

## 2021-05-18 DIAGNOSIS — Z1231 Encounter for screening mammogram for malignant neoplasm of breast: Secondary | ICD-10-CM

## 2021-09-05 ENCOUNTER — Encounter: Payer: Self-pay | Admitting: Family Medicine

## 2021-09-05 DIAGNOSIS — R590 Localized enlarged lymph nodes: Secondary | ICD-10-CM

## 2021-09-05 DIAGNOSIS — E785 Hyperlipidemia, unspecified: Secondary | ICD-10-CM

## 2021-09-05 DIAGNOSIS — I10 Essential (primary) hypertension: Secondary | ICD-10-CM

## 2021-09-05 DIAGNOSIS — M85831 Other specified disorders of bone density and structure, right forearm: Secondary | ICD-10-CM

## 2021-09-05 NOTE — Telephone Encounter (Signed)
Labs ordered.

## 2021-09-05 NOTE — Addendum Note (Signed)
Addended by: Ria Bush on: 09/05/2021 05:18 PM   Modules accepted: Orders

## 2021-09-05 NOTE — Telephone Encounter (Signed)
Dr. Danise Mina, Please see note from patient regarding scheduling her lab work. Order for labs will need to be placed.

## 2021-09-08 ENCOUNTER — Other Ambulatory Visit: Payer: Self-pay

## 2021-09-08 ENCOUNTER — Other Ambulatory Visit (INDEPENDENT_AMBULATORY_CARE_PROVIDER_SITE_OTHER): Payer: Medicare Other

## 2021-09-08 DIAGNOSIS — E785 Hyperlipidemia, unspecified: Secondary | ICD-10-CM | POA: Diagnosis not present

## 2021-09-08 DIAGNOSIS — I10 Essential (primary) hypertension: Secondary | ICD-10-CM

## 2021-09-08 DIAGNOSIS — R590 Localized enlarged lymph nodes: Secondary | ICD-10-CM | POA: Diagnosis not present

## 2021-09-08 DIAGNOSIS — M85831 Other specified disorders of bone density and structure, right forearm: Secondary | ICD-10-CM | POA: Diagnosis not present

## 2021-09-08 LAB — COMPREHENSIVE METABOLIC PANEL
ALT: 15 U/L (ref 0–35)
AST: 18 U/L (ref 0–37)
Albumin: 4.3 g/dL (ref 3.5–5.2)
Alkaline Phosphatase: 65 U/L (ref 39–117)
BUN: 16 mg/dL (ref 6–23)
CO2: 28 mEq/L (ref 19–32)
Calcium: 9.4 mg/dL (ref 8.4–10.5)
Chloride: 102 mEq/L (ref 96–112)
Creatinine, Ser: 0.85 mg/dL (ref 0.40–1.20)
GFR: 67.37 mL/min (ref >60.00)
Glucose, Bld: 84 mg/dL (ref 70–99)
Potassium: 4.1 mEq/L (ref 3.5–5.1)
Sodium: 138 mEq/L (ref 135–145)
Total Bilirubin: 0.5 mg/dL (ref 0.2–1.2)
Total Protein: 6.9 g/dL (ref 6.0–8.3)

## 2021-09-08 LAB — VITAMIN D 25 HYDROXY (VIT D DEFICIENCY, FRACTURES): VITD: 51.88 ng/mL (ref 30.00–100.00)

## 2021-09-08 LAB — CBC WITH DIFFERENTIAL/PLATELET
Basophils Absolute: 0.1 10*3/uL (ref 0.0–0.1)
Basophils Relative: 0.9 % (ref 0.0–3.0)
Eosinophils Absolute: 0.2 10*3/uL (ref 0.0–0.7)
Eosinophils Relative: 2.3 % (ref 0.0–5.0)
HCT: 43.6 % (ref 36.0–46.0)
Hemoglobin: 14.3 g/dL (ref 12.0–15.0)
Lymphocytes Relative: 32 % (ref 12.0–46.0)
Lymphs Abs: 2.1 10*3/uL (ref 0.7–4.0)
MCHC: 32.9 g/dL (ref 30.0–36.0)
MCV: 91.7 fl (ref 78.0–100.0)
Monocytes Absolute: 0.6 10*3/uL (ref 0.1–1.0)
Monocytes Relative: 8.8 % (ref 3.0–12.0)
Neutro Abs: 3.6 10*3/uL (ref 1.4–7.7)
Neutrophils Relative %: 56 % (ref 43.0–77.0)
Platelets: 293 10*3/uL (ref 150.0–400.0)
RBC: 4.75 Mil/uL (ref 3.87–5.11)
RDW: 13.2 % (ref 11.5–15.5)
WBC: 6.4 10*3/uL (ref 4.0–10.5)

## 2021-09-08 LAB — MICROALBUMIN / CREATININE URINE RATIO
Creatinine,U: 50.1 mg/dL
Microalb Creat Ratio: 1.4 mg/g (ref 0.0–30.0)
Microalb, Ur: <0.7 mg/dL (ref 0.0–1.9)

## 2021-09-08 LAB — LIPID PANEL
Cholesterol: 214 mg/dL — ABNORMAL HIGH (ref 0–200)
HDL: 75 mg/dL (ref >39.00)
LDL Cholesterol: 116 mg/dL — ABNORMAL HIGH (ref 0–99)
NonHDL: 138.62
Total CHOL/HDL Ratio: 3
Triglycerides: 114 mg/dL (ref 0.0–149.0)
VLDL: 22.8 mg/dL (ref 0.0–40.0)

## 2021-09-13 NOTE — Progress Notes (Deleted)
Subjective:   Victoria Morrison is a 76 y.o. female who presents for Medicare Annual (Subsequent) preventive examination.  I connected with Victoria Morrison today by telephone and verified that I am speaking with the correct person using two identifiers. Location patient: home Location provider: work Persons participating in the virtual visit: patient, Marine scientist.    I discussed the limitations, risks, security and privacy concerns of performing an evaluation and management service by telephone and the availability of in person appointments. I also discussed with the patient that there may be a patient responsible charge related to this service. The patient expressed understanding and verbally consented to this telephonic visit.    Interactive audio and video telecommunications were attempted between this provider and patient, however failed, due to patient having technical difficulties OR patient did not have access to video capability.  We continued and completed visit with audio only.  Some vital signs may be absent or patient reported.   Time Spent with patient on telephone encounter: *** minutes  Review of Systems           Objective:    There were no vitals filed for this visit. There is no height or weight on file to calculate BMI.  Advanced Directives 09/14/2020 07/28/2019 05/27/2018 02/13/2017 01/03/2016  Does Patient Have a Medical Advance Directive? Yes Yes Yes Yes Yes  Type of Advance Directive Living will;Healthcare Power of Agency;Living will [place];Living will [place];Living will Living will  Does patient want to make changes to medical advance directive? - - - - No - Patient declined  Copy of [place] in Chart? Yes - validated most recent copy scanned in chart (See row information) Yes - validated most recent copy scanned in chart (See row information) No - copy requested Yes Yes    Current  Medications (verified) Outpatient Encounter Medications as of 09/15/2021  Medication Sig   amLODipine (NORVASC) 5 MG tablet TAKE 1 TABLET DAILY   NON FORMULARY DoTerra Lifelong Vitality Supplement   pravastatin (PRAVACHOL) 40 MG tablet TAKE 1 TABLET DAILY   No facility-administered encounter medications on file as of 09/15/2021.    Allergies (verified) Asa [aspirin] and Penicillins   History: Past Medical History:  Diagnosis Date   Allergy    SEASONAL   Asthma    hx asthma in the 80's after pneumonia    Asymptomatic bacteriuria    Cancer ([place])    SKIN   COVID-19 virus infection 06/2020   Diverticulosis    Environmental allergies    dust; pet dander; pollen   History of asthma 1980s   History of MRSA infection 2005   abdomen   History of pneumonia 1980s   History of rheumatoid arthritis 2009   in remission, off MTX since 2005, seronegative   HLD (hyperlipidemia)    HTN (hypertension)    Hx of adenomatous colonic polyps    Hydradenitis 2005   Osteoarthritis 2009   bilateral hands/knees s/p synvisc injections x3 ([facility])   Osteopenia 12/2012   dexa with T score -1.9   Osteopenia    Postmenopausal 2000   Seasonal allergic rhinitis    Past Surgical History:  Procedure Laterality Date   APPENDECTOMY  1954   COLONOSCOPY  12/2013   3 TAs, diverticulosis, rpt 5 yrs Fuller Plan)   COLONOSCOPY  08/2019   3 HP, 1 TA, mod diverticulosis rpt 5 yrs Fuller Plan)   DEXA  12/2012   T score -  1.3 femur, -1.9 radius   MEDIAL PARTIAL KNEE REPLACEMENT Bilateral 09/2013   Dr Jefm Bryant   MENISCUS REPAIR  2009   Left   TONSILLECTOMY     childhood   Family History  Problem Relation Age of Onset   Cancer Mother 57       breast   Breast cancer Mother    Cancer Maternal Grandmother 39       breast (or unsure)   Breast cancer Maternal Grandmother    Cancer Brother 26       lung (smoker)   Alcoholism Father    CAD Father 72       MI   Stroke Neg Hx    Diabetes Neg Hx    Colon  cancer Neg Hx    Rectal cancer Neg Hx    Stomach cancer Neg Hx    Colon polyps Neg Hx    Esophageal cancer Neg Hx    Social History   Socioeconomic History   Marital status: Widowed    Spouse name: Not on file   Number of children: Not on file   Years of education: Not on file   Highest education level: Not on file  Occupational History   Not on file  Tobacco Use   Smoking status: Former    Types: Cigarettes    Quit date: 08/27/1968    Years since quitting: 53.0   Smokeless tobacco: Never  Vaping Use   Vaping Use: Never used  Substance and Sexual Activity   Alcohol use: Yes    Alcohol/week: 6.0 standard drinks    Types: 6 Glasses of wine per week    Comment: Regular-wine 2-3 every other day   Drug use: No   Sexual activity: Never  Other Topics Concern   Not on file  Social History Narrative   Widower - husband Victoria Morrison passed away 07/08/2019 from AML   Goes to [place]   Grown children, 3 sons, 1 daughter, 10 grandchildren   Occupation: retired, was Optician, dispensing   Edu: college   Activity: walks regularly, gym   Diet: good water, vegetables daily   Social Determinants of Radio broadcast assistant Strain: Low Risk    Difficulty of Paying Living Expenses: Not hard at all  Food Insecurity: No Food Insecurity   Worried About Charity fundraiser in the Last Year: Never true   Arboriculturist in the Last Year: Never true  Transportation Needs: No Transportation Needs   Lack of Transportation (Medical): No   Lack of Transportation (Non-Medical): No  Physical Activity: Insufficiently Active   Days of Exercise per Week: 2 days   Minutes of Exercise per Session: 30 min  Stress: No Stress Concern Present   Feeling of Stress : Not at all  Social Connections: Not on file    Tobacco Counseling Counseling given: Not Answered   Clinical Intake:                 Diabetic? No         Activities of Daily Living In your present state of health, do you have any  difficulty performing the following activities: 09/14/2020  Hearing? N  Vision? N  Difficulty concentrating or making decisions? N  Walking or climbing stairs? N  Dressing or bathing? N  Doing errands, shopping? N  Preparing Food and eating ? N  Using the Toilet? N  In the past six months, have you accidently leaked urine? N  Do you  have problems with loss of bowel control? N  Managing your Medications? N  Managing your Finances? N  Housekeeping or managing your Housekeeping? N  Some recent data might be hidden    Patient Care Team: Ria Bush, MD as PCP - General (Family Medicine) Leanor Kail, MD (Inactive) as Consulting Physician (Orthopedic Surgery) Troxler, Rodman Key, DPM (Inactive) as Attending Physician (Podiatry)  Indicate any recent Medical Services you may have received from other than [facility] providers in the past year (date may be approximate).     Assessment:   This is a routine wellness examination for [facility].  Hearing/Vision screen No results found.  Dietary issues and exercise activities discussed:     Goals Addressed   None    Depression Screen PHQ 2/9 Scores 09/14/2020 07/28/2019 05/27/2018 02/13/2017 01/03/2016 12/20/2014 12/09/2013  PHQ - 2 Score 0 1 0 0 0 0 0  PHQ- 9 Score 0 1 0 - - - -    Fall Risk Fall Risk  09/14/2020 07/28/2019 07/22/2019 05/27/2018 02/13/2017  Falls in the past year? 0 0 0 No No  Comment - - Emmi Telephone Survey: data to providers prior to load - -  Number falls in past yr: 0 0 - - -  Injury with Fall? 0 0 - - -  Risk for fall due to : No Fall Risks Medication side effect - - -  Follow up Falls evaluation completed;Falls prevention discussed Falls evaluation completed;Falls prevention discussed - - -    FALL RISK PREVENTION PERTAINING TO THE HOME:  Any stairs in or around the home? {YES/NO:21197} If so, are there any without handrails? {YES/NO:21197} Home free of loose throw rugs in walkways, pet beds, electrical cords, etc?  {YES/NO:21197} Adequate lighting in your home to reduce risk of falls? {YES/NO:21197}  ASSISTIVE DEVICES UTILIZED TO PREVENT FALLS:  Life alert? {YES/NO:21197} Use of a cane, walker or w/c? {YES/NO:21197} Grab bars in the bathroom? {YES/NO:21197} Shower chair or bench in shower? {YES/NO:21197} Elevated toilet seat or a handicapped toilet? {YES/NO:21197}  TIMED UP AND GO:  Was the test performed? No .    Cognitive Function: MMSE - Mini Mental State Exam 09/14/2020 07/28/2019 05/27/2018 02/13/2017 01/03/2016  Not completed: Refused - - - -  Orientation to time - 5 5 5 5   Orientation to Place - 5 5 5 5   Registration - 3 3 3 3   Attention/ Calculation - 5 0 0 0  Recall - 3 3 3 3   Language- name 2 objects - - 0 0 0  Language- repeat - 1 1 1 1   Language- follow 3 step command - - 3 3 3   Language- read & follow direction - - 0 0 0  Write a sentence - - 0 0 0  Copy design - - 0 0 0  Total score - - 20 20 20         Immunizations Immunization History  Administered Date(s) Administered   Influenza,inj,Quad PF,6+ Mos 05/15/2013, 06/07/2014, 05/11/2015   Pneumococcal Conjugate-13 12/09/2013   Pneumococcal Polysaccharide-23 06/08/2004, 05/07/2012   Td 12/04/2012   Zoster, Live 12/04/2012    TDAP status: Up to date  {Flu Vaccine status:2101806}  Pneumococcal vaccine status: Up to date  {Covid-19 vaccine status:2101808}  Qualifies for Shingles Vaccine? Yes   Zostavax completed Yes   {Shingrix Completed?:2101804}  Screening Tests Health Maintenance  Topic Date Due   COVID-19 Vaccine (1) Never done   Zoster Vaccines- Shingrix (1 of 2) Never done   INFLUENZA VACCINE  03/27/2021  MAMMOGRAM  05/18/2022   TETANUS/TDAP  12/05/2022   COLONOSCOPY (Pts 45-43yrs Insurance coverage will need to be confirmed)  08/30/2024   Pneumonia Vaccine 67+ Years old  Completed   DEXA SCAN  Completed   Hepatitis C Screening  Completed   HPV VACCINES  Aged Out    Health Maintenance  Health  Maintenance Due  Topic Date Due   COVID-19 Vaccine (1) Never done   Zoster Vaccines- Shingrix (1 of 2) Never done   INFLUENZA VACCINE  03/27/2021    Colorectal cancer screening: Type of screening: Colonoscopy. Completed 08/31/19. Repeat every 5 years  Mammogram status: Completed 05/18/21. Repeat every year  Bone Density status: Completed 05/11/20. Results reflect: Bone density results: OSTEOPENIA. Repeat every 2 years.  Lung Cancer Screening: (Low Dose CT Chest recommended if Age 66-80 years, 30 pack-year currently smoking OR have quit w/in 15years.) does not qualify.     Additional Screening:  Hepatitis C Screening: does qualify; Completed 05/11/15  Vision Screening: Recommended annual ophthalmology exams for early detection of glaucoma and other disorders of the eye. Is the patient up to date with their annual eye exam?  {YES/NO:21197} Who is the provider or what is the name of the office in which the patient attends annual eye exams? *** If pt is not established with a provider, would they like to be referred to a provider to establish care? {YES/NO:21197}.   Dental Screening: Recommended annual dental exams for proper oral hygiene  Community Resource Referral / Chronic Care Management: CRR required this visit?  {YES/NO:21197}  CCM required this visit?  {YES/NO:21197}     Plan:     I have personally reviewed and noted the following in the patients chart:   Medical and social history Use of alcohol, tobacco or illicit drugs  Current medications and supplements including opioid prescriptions.  Functional ability and status Nutritional status Physical activity Advanced directives List of other physicians Hospitalizations, surgeries, and ER visits in previous 12 months Vitals Screenings to include cognitive, depression, and falls Referrals and appointments  In addition, I have reviewed and discussed with patient certain preventive protocols, quality metrics, and best  practice recommendations. A written personalized care plan for preventive services as well as general preventive health recommendations were provided to patient.   Due to this being a telephonic visit, the after visit summary with patients personalized plan was offered to patient via mail or my-chart. ***Patient declined at this time./ Patient would like to access on my-chart/ per request, patient was mailed a copy of AVS./ Patient preferred to pick up at office at next visit.    Loma Messing, LPN   [id number]/[id number]   Nurse Health Advisor  Nurse Notes: none

## 2021-09-14 DIAGNOSIS — D3613 Benign neoplasm of peripheral nerves and autonomic nervous system of lower limb, including hip: Secondary | ICD-10-CM | POA: Diagnosis not present

## 2021-09-14 DIAGNOSIS — M898X9 Other specified disorders of bone, unspecified site: Secondary | ICD-10-CM | POA: Diagnosis not present

## 2021-09-14 DIAGNOSIS — M2041 Other hammer toe(s) (acquired), right foot: Secondary | ICD-10-CM | POA: Diagnosis not present

## 2021-09-15 ENCOUNTER — Ambulatory Visit: Payer: Medicare Other

## 2021-09-15 ENCOUNTER — Encounter: Payer: Self-pay | Admitting: Family Medicine

## 2021-09-15 ENCOUNTER — Telehealth: Payer: Self-pay

## 2021-09-15 NOTE — Telephone Encounter (Signed)
Fyi to Dr. G 

## 2021-09-15 NOTE — Telephone Encounter (Signed)
Spoke with patient in regards to annual wellness telephone visit for medicare today @ 10:30am. Patient was upset due to being unaware that visit was over the phone and not in person. Patient did not want to speak to complete visit today. Advised patient to contact the office when available to complete annual wellness visit. TM

## 2021-09-19 ENCOUNTER — Encounter: Payer: Self-pay | Admitting: Family Medicine

## 2021-09-19 ENCOUNTER — Other Ambulatory Visit: Payer: Self-pay

## 2021-09-19 ENCOUNTER — Ambulatory Visit (INDEPENDENT_AMBULATORY_CARE_PROVIDER_SITE_OTHER): Payer: Medicare Other | Admitting: Family Medicine

## 2021-09-19 VITALS — BP 128/76 | HR 63 | Temp 97.5°F | Ht 62.75 in | Wt 176.6 lb

## 2021-09-19 DIAGNOSIS — I10 Essential (primary) hypertension: Secondary | ICD-10-CM | POA: Diagnosis not present

## 2021-09-19 DIAGNOSIS — R590 Localized enlarged lymph nodes: Secondary | ICD-10-CM | POA: Diagnosis not present

## 2021-09-19 DIAGNOSIS — R200 Anesthesia of skin: Secondary | ICD-10-CM

## 2021-09-19 DIAGNOSIS — Z96653 Presence of artificial knee joint, bilateral: Secondary | ICD-10-CM

## 2021-09-19 DIAGNOSIS — M85831 Other specified disorders of bone density and structure, right forearm: Secondary | ICD-10-CM | POA: Diagnosis not present

## 2021-09-19 DIAGNOSIS — E785 Hyperlipidemia, unspecified: Secondary | ICD-10-CM

## 2021-09-19 DIAGNOSIS — Z0000 Encounter for general adult medical examination without abnormal findings: Secondary | ICD-10-CM

## 2021-09-19 MED ORDER — AMLODIPINE BESYLATE 5 MG PO TABS: 5.0000 mg | ORAL_TABLET | Freq: Every day | ORAL | 3 refills | Status: DC

## 2021-09-19 MED ORDER — PRAVASTATIN SODIUM 40 MG PO TABS: 40.0000 mg | ORAL_TABLET | Freq: Every day | ORAL | 3 refills | Status: DC

## 2021-09-19 NOTE — Assessment & Plan Note (Addendum)

## 2021-09-19 NOTE — Assessment & Plan Note (Signed)
Saw podiatry - ?neuroma.

## 2021-09-19 NOTE — Assessment & Plan Note (Signed)
Chronic, stable on pravastatin and with healthy diet choices - continue this. The 10-year ASCVD risk score (Arnett DK, et al., 2019) is: 19.1%   Values used to calculate the score:     Age: 76 years     Sex: Female     Is Non-Hispanic African American: No     Diabetic: No     Tobacco smoker: No     Systolic Blood Pressure: 409 mmHg     Is BP treated: Yes     HDL Cholesterol: 75 mg/dL     Total Cholesterol: 214 mg/dL

## 2021-09-19 NOTE — Progress Notes (Signed)
Patient ID: Victoria Morrison, female    DOB: 01/20/1946, 76 y.o.   MRN: [id number]  This visit was conducted in person.  BP 128/76    Pulse 63    Temp (!) 97.5 F (36.4 C) (Temporal)    Ht 5' 2.75" (1.594 m)    Wt 176 lb 9 oz (80.1 kg)    SpO2 96%    BMI 31.53 kg/m    CC: AMW  Subjective:   HPI: Victoria Morrison is a 76 y.o. female presenting on 09/19/2021 for Medicare Wellness (C/o arthritis pain/swelling in bilateral hands. )   Did not see health advisor this year.   Hearing Screening   500Hz  1000Hz  2000Hz  4000Hz   Right ear 25 0 0 40  Left ear 20 20 20  0  Comments: Told last year that high pitch hearing was decreased.   Vision Screening - Comments:: Last eye exam, Apr/May 2022.  [place] Office Visit from 09/19/2021 in [place] at [facility]: Lambert Rhonda W Total Score 0      Upcoming audiology eval next month 0 notes more trouble with high pitches.  Fall Risk  09/19/2021 09/14/2020 07/28/2019 07/22/2019 05/27/2018  Falls in the past year? 0 0 0 0 No  Comment - - - Emmi Telephone Survey: data to providers prior to load -  Number falls in past yr: - 0 0 - -  Injury with Fall? - 0 0 - -  Risk for fall due to : - No Fall Risks Medication side effect - -  Follow up - Falls evaluation completed;Falls prevention discussed Falls evaluation completed;Falls prevention discussed - -    Husband passed away July 06, 2019 of acute myeloid leukemia.    Has family in [place], [place], and [place] as well as local daughter who lives 1 block away - she has moved in with her.   Notes some numbness to right foot between toes   Regular with her Doterra supplements - taking about 2000+ IU vit D.   Preventative: COLONOSCOPY 08/2019 - 3 HP, 1 TA, mod diverticulosis rpt 5 yrs Fuller Plan) Well woman - last 01/2017, normal with absent endocervical zone due to atrophy. Always normal paps in past. Will stop cervical cancer screening.  Mammogram normal 04/2021 Birads1 @ breast center. Gets yearly due to  fmhx. DEXA Date: 12/2012 T score -1.3 femur, -1.9 radius. Takes bone supplement from [place]. Walks regulary.  DEXA 04/2020: R forearm -1.8, RFN -1.3.  Lung cancer screening - not eligible.  Flu shot - declined  COVID vaccine - declined Tetanus - 11/2012  Pneumovax - 05/07/2012, prevnar-13 11/2013  Zostavax - 11/2012  Shingrix - discussed, declines Advanced directives - scanned 12/2015. HCPOA is daughter Darrick Penna) then son Joneen Caraway). Living will in chart.  Seat belt use discussed  Sunscreen use discussed, no changing moles on skin.  Non smoker  Alcohol - 1-2 glasses of white wine once a week Dentist Q6 mo  Eye exam yearly  Bowel - no diarrhea/constipation  Bladder - no incontinence  G4P4    Widower - husband [name] passed away Jul 06, 2019 from AML Goes to [place]  Grown children, 3 sons, 1 daughter, 10 grandchildren Occupation: retired, was Optician, dispensing Edu: college Activity: walks regularly, goes to active adult aerobics class at the [place]: good water, vegetables daily      Relevant past medical, surgical, family and social history reviewed and updated as indicated. Interim medical history since our last visit reviewed. Allergies and medications reviewed and updated. Outpatient  Medications Prior to Visit  Medication Sig Dispense Refill   NON FORMULARY DoTerra Lifelong Vitality Supplement     amLODipine (NORVASC) 5 MG tablet TAKE 1 TABLET DAILY 90 tablet 1   pravastatin (PRAVACHOL) 40 MG tablet TAKE 1 TABLET DAILY 90 tablet 1   No facility-administered medications prior to visit.     Per HPI unless specifically indicated in ROS section below Review of Systems  Objective:  BP 128/76    Pulse 63    Temp (!) 97.5 F (36.4 C) (Temporal)    Ht 5' 2.75" (1.594 m)    Wt 176 lb 9 oz (80.1 kg)    SpO2 96%    BMI 31.53 kg/m   Wt Readings from Last 3 Encounters:  09/19/21 176 lb 9 oz (80.1 kg)  09/21/20 182 lb 7 oz (82.8 kg)  07/12/20 184 lb 6 oz (83.6 kg)      Physical  Exam Vitals and nursing note reviewed.  Constitutional:      Appearance: Normal appearance. She is not ill-appearing.  HENT:     Head: Normocephalic and atraumatic.     Right Ear: Tympanic membrane, ear canal and external ear normal. There is no impacted cerumen.     Left Ear: Tympanic membrane, ear canal and external ear normal. There is no impacted cerumen.  Eyes:     General:        Right eye: No discharge.        Left eye: No discharge.     Extraocular Movements: Extraocular movements intact.     Conjunctiva/sclera: Conjunctivae normal.     Pupils: Pupils are equal, round, and reactive to light.  Neck:     Thyroid: No thyroid mass or thyromegaly.     Vascular: No carotid bruit.  Cardiovascular:     Rate and Rhythm: Normal rate and regular rhythm.     Pulses: Normal pulses.     Heart sounds: Normal heart sounds. No murmur heard. Pulmonary:     Effort: Pulmonary effort is normal. No respiratory distress.     Breath sounds: Normal breath sounds. No wheezing, rhonchi or rales.  Abdominal:     General: Bowel sounds are normal. There is no distension.     Palpations: Abdomen is soft. There is no mass.     Tenderness: There is no abdominal tenderness. There is no guarding or rebound.     Hernia: No hernia is present.  Musculoskeletal:     Cervical back: Normal range of motion and neck supple. No rigidity.     Right lower leg: No edema.     Left lower leg: No edema.  Lymphadenopathy:     Cervical: No cervical adenopathy.  Skin:    General: Skin is warm and dry.     Findings: No rash.  Neurological:     General: No focal deficit present.     Mental Status: She is alert. Mental status is at baseline.     Comments:  Recall 3/3 Calculation 5/5 DLROW  Psychiatric:        Mood and Affect: Mood normal.        Behavior: Behavior normal.      Results for orders placed or performed in visit on 09/08/21  Microalbumin / creatinine urine ratio  Result Value Ref Range   Microalb,  Ur <0.7 0.0 - 1.9 mg/dL   Creatinine,U 50.1 mg/dL   Microalb Creat Ratio 1.4 0.0 - 30.0 mg/g  Lipid panel  Result Value Ref Range  Cholesterol 214 (H) 0 - 200 mg/dL   Triglycerides 114.0 0.0 - 149.0 mg/dL   HDL 75.00 >39.00 mg/dL   VLDL 22.8 0.0 - 40.0 mg/dL   LDL Cholesterol 116 (H) 0 - 99 mg/dL   Total CHOL/HDL Ratio 3    NonHDL 138.62   Comprehensive metabolic panel  Result Value Ref Range   Sodium 138 135 - 145 mEq/L   Potassium 4.1 3.5 - 5.1 mEq/L   Chloride 102 96 - 112 mEq/L   CO2 28 19 - 32 mEq/L   Glucose, Bld 84 70 - 99 mg/dL   BUN 16 6 - 23 mg/dL   Creatinine, Ser 0.85 0.40 - 1.20 mg/dL   Total Bilirubin 0.5 0.2 - 1.2 mg/dL   Alkaline Phosphatase 65 39 - 117 U/L   AST 18 0 - 37 U/L   ALT 15 0 - 35 U/L   Total Protein 6.9 6.0 - 8.3 g/dL   Albumin 4.3 3.5 - 5.2 g/dL   GFR 67.37 >60.00 mL/min   Calcium 9.4 8.4 - 10.5 mg/dL  CBC with Differential/Platelet  Result Value Ref Range   WBC 6.4 4.0 - 10.5 K/uL   RBC 4.75 3.87 - 5.11 Mil/uL   Hemoglobin 14.3 12.0 - 15.0 g/dL   HCT 43.6 36.0 - 46.0 %   MCV 91.7 78.0 - 100.0 fl   MCHC 32.9 30.0 - 36.0 g/dL   RDW 13.2 11.5 - 15.5 %   Platelets 293.0 150.0 - 400.0 K/uL   Neutrophils Relative % 56.0 43.0 - 77.0 %   Lymphocytes Relative 32.0 12.0 - 46.0 %   Monocytes Relative 8.8 3.0 - 12.0 %   Eosinophils Relative 2.3 0.0 - 5.0 %   Basophils Relative 0.9 0.0 - 3.0 %   Neutro Abs 3.6 1.4 - 7.7 K/uL   Lymphs Abs 2.1 0.7 - 4.0 K/uL   Monocytes Absolute 0.6 0.1 - 1.0 K/uL   Eosinophils Absolute 0.2 0.0 - 0.7 K/uL   Basophils Absolute 0.1 0.0 - 0.1 K/uL  VITAMIN D 25 Hydroxy (Vit-D Deficiency, Fractures)  Result Value Ref Range   VITD 51.88 30.00 - 100.00 ng/mL    Assessment & Plan:  This visit occurred during the SARS-CoV-2 public health emergency.  Safety protocols were in place, including screening questions prior to the visit, additional usage of staff PPE, and extensive cleaning of exam room while observing  appropriate contact time as indicated for disinfecting solutions.   Problem List Items Addressed This Visit     Medicare annual wellness visit, subsequent - Primary (Chronic)    I have personally reviewed the Medicare Annual Wellness questionnaire and have noted 1. The patient's medical and social history 2. Their use of alcohol, tobacco or illicit drugs 3. Their current medications and supplements 4. The patient's functional ability including ADL's, fall risks, home safety risks and hearing or visual impairment. Cognitive function has been assessed and addressed as indicated.  5. Diet and physical activity 6. Evidence for depression or mood disorders The patients weight, height, BMI have been recorded in the chart. I have made referrals, counseling and provided education to the patient based on review of the above and I have provided the pt with a written personalized care plan for preventive services. Provider list updated.. See scanned questionairre as needed for further documentation. Reviewed preventative protocols and updated unless pt declined.       HTN (hypertension)    Chronic, well controlled on amlodipine 5mg  daily.  Relevant Medications   amLODipine (NORVASC) 5 MG tablet   pravastatin (PRAVACHOL) 40 MG tablet   HLD (hyperlipidemia)    Chronic, stable on pravastatin and with healthy diet choices - continue this. The 10-year ASCVD risk score (Arnett DK, et al., 2019) is: 19.1%   Values used to calculate the score:     Age: 89 years     Sex: Female     Is Non-Hispanic African American: No     Diabetic: No     Tobacco smoker: No     Systolic Blood Pressure: 253 mmHg     Is BP treated: Yes     HDL Cholesterol: 75 mg/dL     Total Cholesterol: 214 mg/dL       Relevant Medications   amLODipine (NORVASC) 5 MG tablet   pravastatin (PRAVACHOL) 40 MG tablet   Numbness of toes    Saw podiatry - ?neuroma.       Osteopenia    Continue DoTerra bone health  supplements with calcium/vit D, continue regular weight bearing exercise.       Status post bilateral unicompartmental knee replacement   Virchow's node    This has resolved.         Meds ordered this encounter  Medications   amLODipine (NORVASC) 5 MG tablet    Sig: Take 1 tablet (5 mg total) by mouth daily.    Dispense:  90 tablet    Refill:  3   pravastatin (PRAVACHOL) 40 MG tablet    Sig: Take 1 tablet (40 mg total) by mouth daily.    Dispense:  90 tablet    Refill:  3   No orders of the defined types were placed in this encounter.   Patient instructions: You are doing well today  Continue current medicines and healthy diet and lifestyle.  Return as needed or in 1 year for next wellness visit.  Return in 6 months for labs only - kidney check.   Follow up plan: Return in about 1 year (around 09/19/2022) for annual exam, prior fasting for blood work.  Ria Bush, MD

## 2021-09-19 NOTE — Assessment & Plan Note (Signed)
Continue DoTerra bone health supplements with calcium/vit D, continue regular weight bearing exercise.

## 2021-09-19 NOTE — Assessment & Plan Note (Signed)
Chronic, well controlled on amlodipine 5mg  daily.

## 2021-09-19 NOTE — Assessment & Plan Note (Signed)
This has resolved.

## 2021-09-19 NOTE — Patient Instructions (Addendum)
You are doing well today  Continue current medicines and healthy diet and lifestyle.  Return as needed or in 1 year for next wellness visit.  Return in 6 months for labs only - kidney check.   Health Maintenance After Age 75 After age 10, you are at a higher risk for certain long-term diseases and infections as well as injuries from falls. Falls are a major cause of broken bones and head injuries in people who are older than age 30. Getting regular preventive care can help to keep you healthy and well. Preventive care includes getting regular testing and making lifestyle changes as recommended by your health care provider. Talk with your health care provider about: Which screenings and tests you should have. A screening is a test that checks for a disease when you have no symptoms. A diet and exercise plan that is right for you. What should I know about screenings and tests to prevent falls? Screening and testing are the best ways to find a health problem early. Early diagnosis and treatment give you the best chance of managing medical conditions that are common after age 70. Certain conditions and lifestyle choices may make you more likely to have a fall. Your health care provider may recommend: Regular vision checks. Poor vision and conditions such as cataracts can make you more likely to have a fall. If you wear glasses, make sure to get your prescription updated if your vision changes. Medicine review. Work with your health care provider to regularly review all of the medicines you are taking, including over-the-counter medicines. Ask your health care provider about any side effects that may make you more likely to have a fall. Tell your health care provider if any medicines that you take make you feel dizzy or sleepy. Strength and balance checks. Your health care provider may recommend certain tests to check your strength and balance while standing, walking, or changing positions. Foot health  exam. Foot pain and numbness, as well as not wearing proper footwear, can make you more likely to have a fall. Screenings, including: Osteoporosis screening. Osteoporosis is a condition that causes the bones to get weaker and break more easily. Blood pressure screening. Blood pressure changes and medicines to control blood pressure can make you feel dizzy. Depression screening. You may be more likely to have a fall if you have a fear of falling, feel depressed, or feel unable to do activities that you used to do. Alcohol use screening. Using too much alcohol can affect your balance and may make you more likely to have a fall. Follow these instructions at home: Lifestyle Do not drink alcohol if: Your health care provider tells you not to drink. If you drink alcohol: Limit how much you have to: 0-1 drink a day for women. 0-2 drinks a day for men. Know how much alcohol is in your drink. In the [place] one drink equals one 12 oz bottle of beer (355 mL), one 5 oz glass of wine (148 mL), or one 1 oz glass of hard liquor (44 mL). Do not use any products that contain nicotine or tobacco. These products include cigarettes, chewing tobacco, and vaping devices, such as e-cigarettes. If you need help quitting, ask your health care provider. Activity  Follow a regular exercise program to stay fit. This will help you maintain your balance. Ask your health care provider what types of exercise are appropriate for you. If you need a cane or walker, use it as recommended by your health  care provider. Wear supportive shoes that have nonskid soles. Safety  Remove any tripping hazards, such as rugs, cords, and clutter. Install safety equipment such as grab bars in bathrooms and safety rails on stairs. Keep rooms and walkways well-lit. General instructions Talk with your health care provider about your risks for falling. Tell your health care provider if: You fall. Be sure to tell your health care provider  about all falls, even ones that seem minor. You feel dizzy, tiredness (fatigue), or off-balance. Take over-the-counter and prescription medicines only as told by your health care provider. These include supplements. Eat a healthy diet and maintain a healthy weight. A healthy diet includes low-fat dairy products, low-fat (lean) meats, and fiber from whole grains, beans, and lots of fruits and vegetables. Stay current with your vaccines. Schedule regular health, dental, and eye exams. Summary Having a healthy lifestyle and getting preventive care can help to protect your health and wellness after age 24. Screening and testing are the best way to find a health problem early and help you avoid having a fall. Early diagnosis and treatment give you the best chance for managing medical conditions that are more common for people who are older than age 33. Falls are a major cause of broken bones and head injuries in people who are older than age 57. Take precautions to prevent a fall at home. Work with your health care provider to learn what changes you can make to improve your health and wellness and to prevent falls. This information is not intended to replace advice given to you by your health care provider. Make sure you discuss any questions you have with your health care provider. Document Revised: 01/02/2021 Document Reviewed: 01/02/2021 Elsevier Patient Education  [place].

## 2021-09-29 DIAGNOSIS — J301 Allergic rhinitis due to pollen: Secondary | ICD-10-CM | POA: Diagnosis not present

## 2021-09-29 DIAGNOSIS — H6123 Impacted cerumen, bilateral: Secondary | ICD-10-CM | POA: Diagnosis not present

## 2021-09-29 DIAGNOSIS — H9313 Tinnitus, bilateral: Secondary | ICD-10-CM | POA: Diagnosis not present

## 2021-10-31 IMAGING — MG MM DIGITAL SCREENING BILAT W/ TOMO AND CAD
8 series · 8 of 24 positions shown · non-contrast
Comparison: Previous exam(s).

CLINICAL DATA: Screening.

EXAM:
DIGITAL SCREENING BILATERAL MAMMOGRAM WITH TOMOSYNTHESIS AND CAD
TECHNIQUE: Bilateral screening digital craniocaudal and mediolateral oblique
mammograms were obtained. Bilateral screening digital breast
tomosynthesis was performed. The images were evaluated with
computer-aided detection.

[L MLO synth-2D]
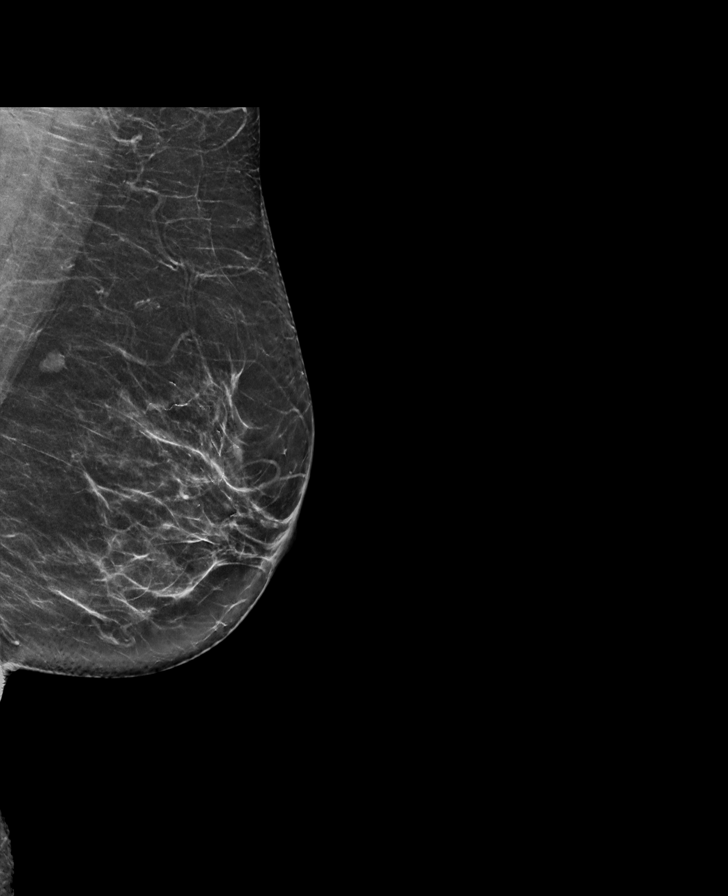

[R MLO synth-2D]
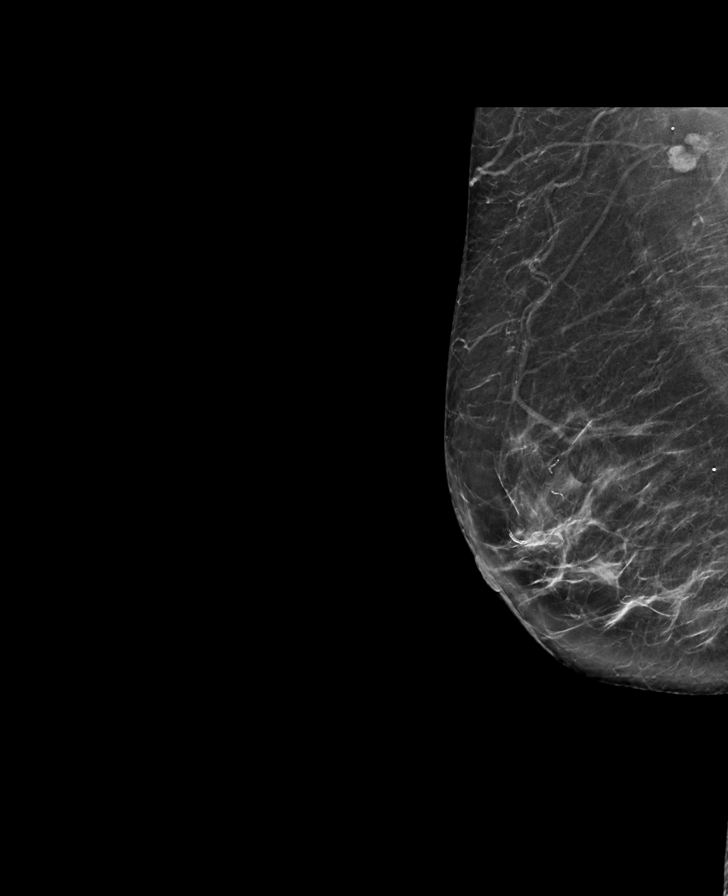

[R CC synth-2D]
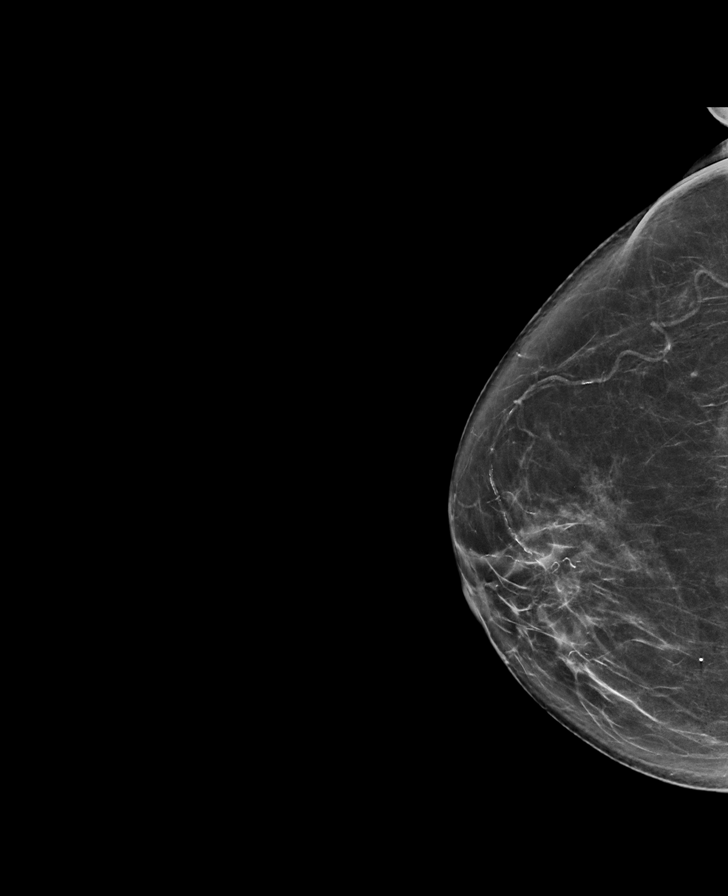

[L CC synth-2D]
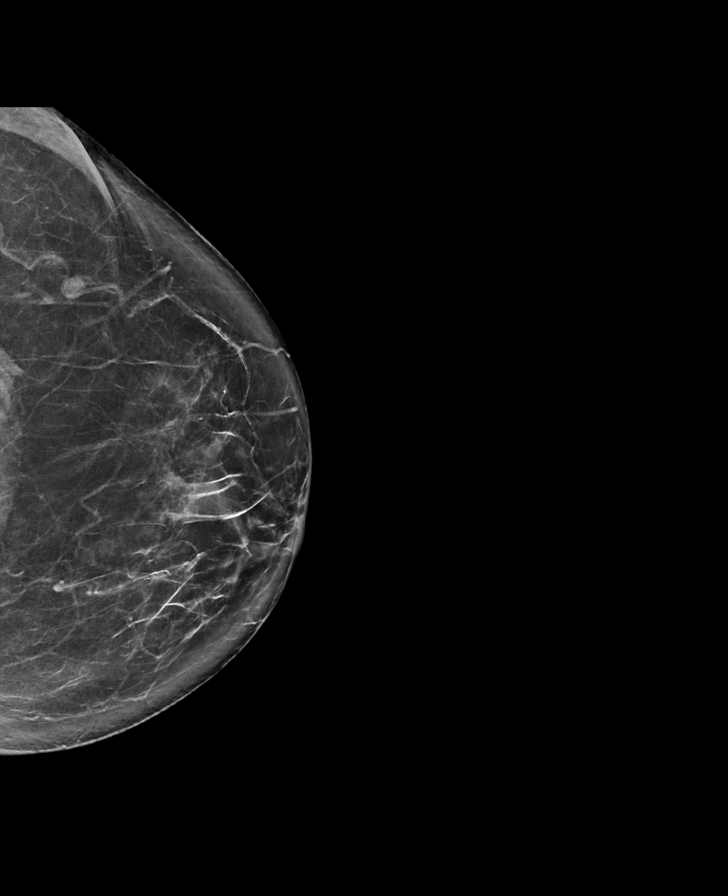

[R CC tomo · tomo slice 35/70.0]
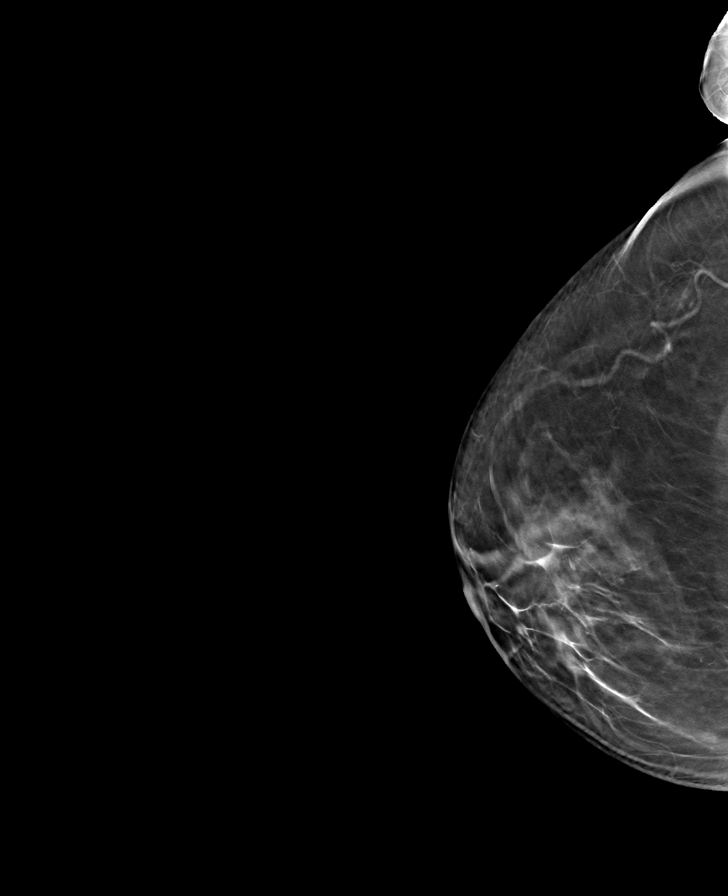

[L MLO tomo · tomo slice 37/72.0]
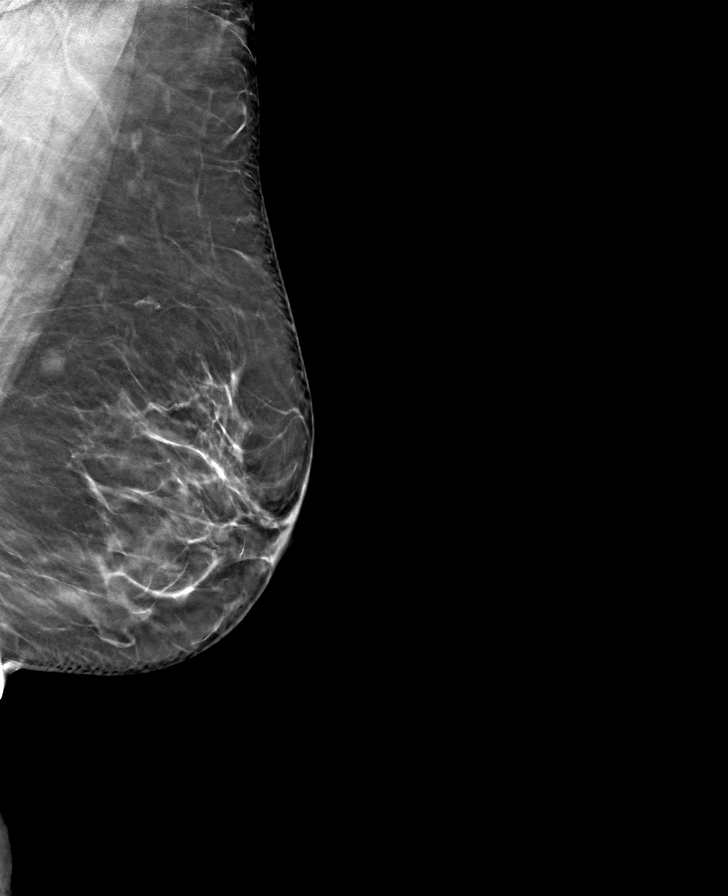

[L CC tomo · tomo slice 38/75.0]
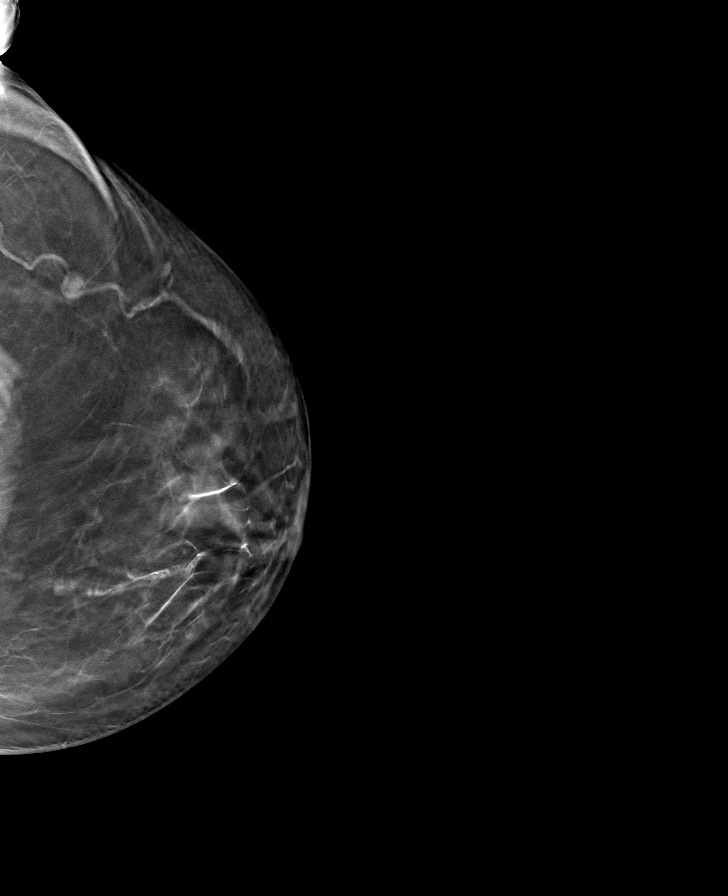

[R MLO tomo · tomo slice 39/76.0]
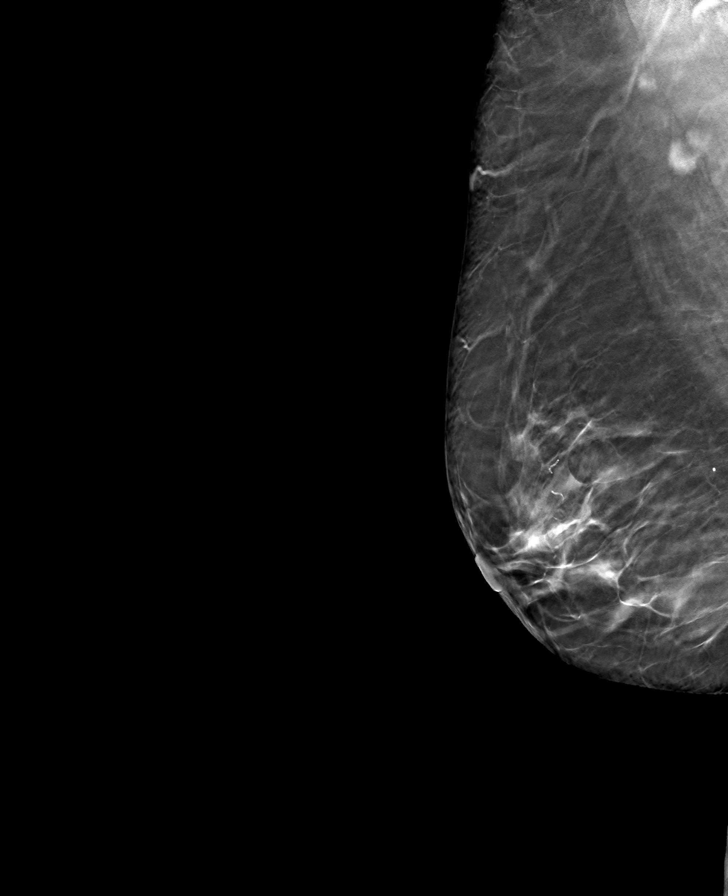

[8 of 24 positions shown; findings below may reference images not displayed]

ACR Breast Density Category b: There are scattered areas of
fibroglandular density.
FINDINGS: There are no findings suspicious for malignancy.
IMPRESSION: No mammographic evidence of malignancy. A result letter of this
screening mammogram will be mailed directly to the patient.

RECOMMENDATION:
Screening mammogram in one year. (Code:51-O-LD2)

BI-RADS CATEGORY  1: Negative.

## 2022-03-18 ENCOUNTER — Other Ambulatory Visit: Payer: Self-pay | Admitting: Family Medicine

## 2022-03-18 DIAGNOSIS — I10 Essential (primary) hypertension: Secondary | ICD-10-CM

## 2022-03-18 DIAGNOSIS — E785 Hyperlipidemia, unspecified: Secondary | ICD-10-CM

## 2022-03-20 ENCOUNTER — Other Ambulatory Visit (INDEPENDENT_AMBULATORY_CARE_PROVIDER_SITE_OTHER): Payer: Medicare Other

## 2022-03-20 DIAGNOSIS — I10 Essential (primary) hypertension: Secondary | ICD-10-CM

## 2022-03-20 DIAGNOSIS — E785 Hyperlipidemia, unspecified: Secondary | ICD-10-CM

## 2022-03-20 LAB — BASIC METABOLIC PANEL
BUN: 14 mg/dL (ref 6–23)
CO2: 29 mEq/L (ref 19–32)
Calcium: 9.4 mg/dL (ref 8.4–10.5)
Chloride: 104 mEq/L (ref 96–112)
Creatinine, Ser: 0.84 mg/dL (ref 0.40–1.20)
GFR: 68.08 mL/min (ref >60.00)
Glucose, Bld: 87 mg/dL (ref 70–99)
Potassium: 4 mEq/L (ref 3.5–5.1)
Sodium: 142 mEq/L (ref 135–145)

## 2022-03-20 LAB — LIPID PANEL
Cholesterol: 207 mg/dL — ABNORMAL HIGH (ref 0–200)
HDL: 83 mg/dL (ref >39.00)
LDL Cholesterol: 102 mg/dL — ABNORMAL HIGH (ref 0–99)
NonHDL: 124.06
Total CHOL/HDL Ratio: 2
Triglycerides: 108 mg/dL (ref 0.0–149.0)
VLDL: 21.6 mg/dL (ref 0.0–40.0)

## 2022-04-23 ENCOUNTER — Encounter: Payer: Self-pay | Admitting: Family Medicine

## 2022-04-23 ENCOUNTER — Other Ambulatory Visit: Payer: Self-pay | Admitting: Family Medicine

## 2022-04-23 DIAGNOSIS — Z1231 Encounter for screening mammogram for malignant neoplasm of breast: Secondary | ICD-10-CM

## 2022-05-21 ENCOUNTER — Ambulatory Visit
Admission: RE | Admit: 2022-05-21 | Discharge: 2022-05-21 | Disposition: A | Discharge status: Home / Self Care | Payer: Medicare Other | Source: Ambulatory Visit | Attending: Family Medicine | Admitting: Family Medicine

## 2022-05-21 DIAGNOSIS — Z1231 Encounter for screening mammogram for malignant neoplasm of breast: Secondary | ICD-10-CM | POA: Diagnosis not present

## 2022-07-24 DIAGNOSIS — L578 Other skin changes due to chronic exposure to nonionizing radiation: Secondary | ICD-10-CM | POA: Diagnosis not present

## 2022-07-24 DIAGNOSIS — D2272 Melanocytic nevi of left lower limb, including hip: Secondary | ICD-10-CM | POA: Diagnosis not present

## 2022-07-24 DIAGNOSIS — D225 Melanocytic nevi of trunk: Secondary | ICD-10-CM | POA: Diagnosis not present

## 2022-07-24 DIAGNOSIS — L570 Actinic keratosis: Secondary | ICD-10-CM | POA: Diagnosis not present

## 2022-07-24 DIAGNOSIS — D2271 Melanocytic nevi of right lower limb, including hip: Secondary | ICD-10-CM | POA: Diagnosis not present

## 2022-07-24 DIAGNOSIS — L821 Other seborrheic keratosis: Secondary | ICD-10-CM | POA: Diagnosis not present

## 2022-07-24 DIAGNOSIS — L814 Other melanin hyperpigmentation: Secondary | ICD-10-CM | POA: Diagnosis not present

## 2022-07-24 DIAGNOSIS — D2261 Melanocytic nevi of right upper limb, including shoulder: Secondary | ICD-10-CM | POA: Diagnosis not present

## 2022-07-24 DIAGNOSIS — X32XXXA Exposure to sunlight, initial encounter: Secondary | ICD-10-CM | POA: Diagnosis not present

## 2022-07-24 DIAGNOSIS — D2262 Melanocytic nevi of left upper limb, including shoulder: Secondary | ICD-10-CM | POA: Diagnosis not present

## 2022-09-08 ENCOUNTER — Other Ambulatory Visit: Payer: Self-pay | Admitting: Family Medicine

## 2022-09-08 DIAGNOSIS — M85831 Other specified disorders of bone density and structure, right forearm: Secondary | ICD-10-CM

## 2022-09-08 DIAGNOSIS — E785 Hyperlipidemia, unspecified: Secondary | ICD-10-CM

## 2022-09-08 DIAGNOSIS — I10 Essential (primary) hypertension: Secondary | ICD-10-CM

## 2022-09-12 ENCOUNTER — Other Ambulatory Visit (INDEPENDENT_AMBULATORY_CARE_PROVIDER_SITE_OTHER): Payer: Medicare Other

## 2022-09-12 DIAGNOSIS — M85831 Other specified disorders of bone density and structure, right forearm: Secondary | ICD-10-CM

## 2022-09-12 DIAGNOSIS — E785 Hyperlipidemia, unspecified: Secondary | ICD-10-CM

## 2022-09-12 DIAGNOSIS — I10 Essential (primary) hypertension: Secondary | ICD-10-CM

## 2022-09-12 LAB — VITAMIN D 25 HYDROXY (VIT D DEFICIENCY, FRACTURES): VITD: 40.57 ng/mL (ref 30.00–100.00)

## 2022-09-12 LAB — COMPREHENSIVE METABOLIC PANEL
ALT: 14 U/L (ref 0–35)
AST: 15 U/L (ref 0–37)
Albumin: 4.3 g/dL (ref 3.5–5.2)
Alkaline Phosphatase: 76 U/L (ref 39–117)
BUN: 15 mg/dL (ref 6–23)
CO2: 29 mEq/L (ref 19–32)
Calcium: 9.5 mg/dL (ref 8.4–10.5)
Chloride: 104 mEq/L (ref 96–112)
Creatinine, Ser: 0.77 mg/dL (ref 0.40–1.20)
GFR: 75.31 mL/min (ref >60.00)
Glucose, Bld: 92 mg/dL (ref 70–99)
Potassium: 4.2 mEq/L (ref 3.5–5.1)
Sodium: 141 mEq/L (ref 135–145)
Total Bilirubin: 0.7 mg/dL (ref 0.2–1.2)
Total Protein: 6.8 g/dL (ref 6.0–8.3)

## 2022-09-12 LAB — MICROALBUMIN / CREATININE URINE RATIO
Creatinine,U: 69.7 mg/dL
Microalb Creat Ratio: 1.5 mg/g (ref 0.0–30.0)
Microalb, Ur: 1.1 mg/dL (ref 0.0–1.9)

## 2022-09-12 LAB — LIPID PANEL
Cholesterol: 257 mg/dL — ABNORMAL HIGH (ref 0–200)
HDL: 71.1 mg/dL (ref >39.00)
LDL Cholesterol: 163 mg/dL — ABNORMAL HIGH (ref 0–99)
NonHDL: 186.37
Total CHOL/HDL Ratio: 4
Triglycerides: 116 mg/dL (ref 0.0–149.0)
VLDL: 23.2 mg/dL (ref 0.0–40.0)

## 2022-09-12 LAB — TSH: TSH: 2.48 u[IU]/mL (ref 0.35–5.50)

## 2022-09-13 DIAGNOSIS — M898X9 Other specified disorders of bone, unspecified site: Secondary | ICD-10-CM | POA: Diagnosis not present

## 2022-09-13 DIAGNOSIS — M2041 Other hammer toe(s) (acquired), right foot: Secondary | ICD-10-CM | POA: Diagnosis not present

## 2022-09-14 ENCOUNTER — Other Ambulatory Visit: Payer: Medicare Other

## 2022-09-19 DIAGNOSIS — M1990 Unspecified osteoarthritis, unspecified site: Secondary | ICD-10-CM | POA: Insufficient documentation

## 2022-09-20 ENCOUNTER — Ambulatory Visit (INDEPENDENT_AMBULATORY_CARE_PROVIDER_SITE_OTHER): Payer: Medicare Other

## 2022-09-20 VITALS — Ht 62.75 in | Wt 176.0 lb

## 2022-09-20 DIAGNOSIS — Z0000 Encounter for general adult medical examination without abnormal findings: Secondary | ICD-10-CM | POA: Diagnosis not present

## 2022-09-20 NOTE — Progress Notes (Addendum)
Virtual Visit via Telephone Note  I connected with  [name] on 09/20/22 at  8:15 AM EST by telephone and verified that I am speaking with the correct person using two identifiers.  Location: Patient: home Provider: [place] Persons participating in the virtual visit: [place]   I discussed the limitations, risks, security and privacy concerns of performing an evaluation and management service by telephone and the availability of in person appointments. The patient expressed understanding and agreed to proceed.  Interactive audio and video telecommunications were attempted between this nurse and patient, however failed, due to patient having technical difficulties OR patient did not have access to video capability.  We continued and completed visit with audio only.  Some vital signs may be absent or patient reported.   Dionisio David, LPN  Subjective:   Blaise Palladino is a 77 y.o. female who presents for Medicare Annual (Subsequent) preventive examination.  Review of Systems     Cardiac Risk Factors include: advanced age (>62mn, >>55women);hypertension     Objective:    There were no vitals filed for this visit. There is no height or weight on file to calculate BMI.     09/20/2022    8:20 AM 09/14/2020   10:35 AM 07/28/2019   10:50 AM 05/27/2018    8:51 AM 02/13/2017    1:13 PM 01/03/2016   11:51 AM  Advanced Directives  Does Patient Have a Medical Advance Directive? Yes Yes Yes Yes Yes Yes  Type of AParamedicof A[place]ansonLiving will Living will;Healthcare Power of ASt. Vincent CollegeLiving will HOaklandLiving will H[place]Living will Living will  Does patient want to make changes to medical advance directive? No - Patient declined     No - Patient declined  Copy of HGlacier Viewin Chart? Yes - validated most recent copy scanned in chart (See row  information) Yes - validated most recent copy scanned in chart (See row information) Yes - validated most recent copy scanned in chart (See row information) No - copy requested Yes Yes    Current Medications (verified) Outpatient Encounter Medications as of 09/20/2022  Medication Sig   amLODipine (NORVASC) 5 MG tablet Take 1 tablet (5 mg total) by mouth daily.   NON FORMULARY DoTerra Lifelong Vitality Supplement   pravastatin (PRAVACHOL) 40 MG tablet Take 1 tablet (40 mg total) by mouth once a week.   No facility-administered encounter medications on file as of 09/20/2022.    Allergies (verified) Asa [aspirin] and Penicillins   History: Past Medical History:  Diagnosis Date   Allergy    SEASONAL   Asthma    hx asthma in the 80's after pneumonia    Asymptomatic bacteriuria    Cancer ([place]    SKIN   COVID-19 virus infection 06/2020   Diverticulosis    Environmental allergies    dust; pet dander; pollen   History of asthma 1980s   History of MRSA infection 2005   abdomen   History of pneumonia 1980s   History of rheumatoid arthritis 2009   in remission, off MTX since 2005, seronegative   HLD (hyperlipidemia)    HTN (hypertension)    Hx of adenomatous colonic polyps    Hydradenitis 2005   Osteoarthritis 2009   bilateral hands/knees s/p synvisc injections x3 ([facility])   Osteopenia 12/2012   dexa with T score -1.9   Osteopenia    Postmenopausal 2000  Seasonal allergic rhinitis    Past Surgical History:  Procedure Laterality Date   APPENDECTOMY  1954   COLONOSCOPY  12/2013   3 TAs, diverticulosis, rpt 5 yrs [facility])   COLONOSCOPY  08/2019   3 HP, 1 TA, mod diverticulosis rpt 5 yrs [facility])   DEXA  12/2012   T score -1.3 femur, -1.9 radius   MEDIAL PARTIAL KNEE REPLACEMENT Bilateral 09/2013   Dr Jefm Bryant   MENISCUS REPAIR  2009   Left   TONSILLECTOMY     childhood   Family History  Problem Relation Age of Onset   Cancer Mother 68       breast   Breast  cancer Mother    Cancer Maternal Grandmother 38       breast (or unsure)   Breast cancer Maternal Grandmother    Cancer Brother 81       lung (smoker)   Alcoholism Father    CAD Father 51       MI   Stroke Neg Hx    Diabetes Neg Hx    Colon cancer Neg Hx    Rectal cancer Neg Hx    Stomach cancer Neg Hx    Colon polyps Neg Hx    Esophageal cancer Neg Hx    Social History   Socioeconomic History   Marital status: Widowed    Spouse name: Not on file   Number of children: Not on file   Years of education: Not on file   Highest education level: Not on file  Occupational History   Not on file  Tobacco Use   Smoking status: Former    Types: Cigarettes    Quit date: 08/27/1968    Years since quitting: 54.1   Smokeless tobacco: Never  Vaping Use   Vaping Use: Never used  Substance and Sexual Activity   Alcohol use: Yes    Alcohol/week: 6.0 standard drinks of alcohol    Types: 6 Glasses of wine per week    Comment: Regular-wine 2-3 every other day   Drug use: No   Sexual activity: Never  Other Topics Concern   Not on file  Social History Narrative   Widower - husband [name] passed away 2019-06-16 from AML   Goes to [place]   Grown children, 3 sons, 1 daughter, 10 grandchildren   Occupation: retired, was Optician, dispensing   Edu: college   Activity: walks regularly, gym   Diet: good water, vegetables daily   Social Determinants of Health   Financial Resource Strain: Low Risk  (09/20/2022)   Overall Financial Resource Strain (CARDIA)    Difficulty of Paying Living Expenses: Not hard at all  Food Insecurity: No Food Insecurity (09/20/2022)   Hunger Vital Sign    Worried About Running Out of Food in the Last Year: Never true    Ran Out of Food in the Last Year: Never true  Transportation Needs: No Transportation Needs (09/20/2022)   PRAPARE - Hydrologist (Medical): No    Lack of Transportation (Non-Medical): No  Physical Activity: Insufficiently  Active (09/20/2022)   Exercise Vital Sign    Days of Exercise per Week: 3 days    Minutes of Exercise per Session: 40 min  Stress: No Stress Concern Present (09/20/2022)   [place]    Feeling of Stress : Not at all  Social Connections: Moderately Integrated (09/20/2022)   Social Connection and Isolation Panel [NHANES]  Frequency of Communication with Friends and Family: More than three times a week    Frequency of Social Gatherings with Friends and Family: More than three times a week    Attends Religious Services: More than 4 times per year    Active Member of [employer] or Organizations: Yes    Attends Archivist Meetings: More than 4 times per year    Marital Status: Widowed    Tobacco Counseling Counseling given: Not Answered   Clinical Intake:  Pre-visit preparation completed: Yes  Pain : No/denies pain     Nutritional Risks: None Diabetes: No  How often do you need to have someone help you when you read instructions, pamphlets, or other written materials from your doctor or pharmacy?: 1 - Never  Diabetic?no  Interpreter Needed?: No  Information entered by :: Kirke Shaggy, LPN   Activities of Daily Living    09/20/2022    8:21 AM  In your present state of health, do you have any difficulty performing the following activities:  Hearing? 1  Vision? 0  Difficulty concentrating or making decisions? 0  Walking or climbing stairs? 0  Dressing or bathing? 0  Doing errands, shopping? 0  Preparing Food and eating ? N  Using the Toilet? N  In the past six months, have you accidently leaked urine? N  Do you have problems with loss of bowel control? N  Managing your Medications? N  Managing your Finances? N  Housekeeping or managing your Housekeeping? N    Patient Care Team: Ria Bush, MD as PCP - General (Family Medicine) Leanor Kail, MD (Inactive) as Consulting Physician  (Orthopedic Surgery) Troxler, Rodman Key, DPM (Inactive) as Attending Physician (Podiatry)  Indicate any recent Medical Services you may have received from other than [facility] providers in the past year (date may be approximate).     Assessment:   This is a routine wellness examination for [facility].  Hearing/Vision screen Hearing Screening - Comments:: No aids Vision Screening - Comments:: Wears glasses- [facility]  Dietary issues and exercise activities discussed: Current Exercise Habits: Home exercise routine, Type of exercise: walking, Time (Minutes): 45, Frequency (Times/Week): 3, Weekly Exercise (Minutes/Week): 135, Intensity: Mild   Goals Addressed             This Visit's Progress    DIET - EAT MORE FRUITS AND VEGETABLES         Depression Screen    09/20/2022    8:18 AM 09/19/2021   10:05 AM 09/14/2020   10:36 AM 07/28/2019   10:52 AM 05/27/2018    8:52 AM 02/13/2017    1:13 PM 01/03/2016   11:38 AM  PHQ 2/9 Scores  PHQ - 2 Score 0 0 0 1 0 0 0  PHQ- 9 Score 0  0 1 0      Fall Risk    09/20/2022    8:20 AM 09/19/2021   10:05 AM 09/14/2020   10:36 AM 07/28/2019   10:50 AM 07/22/2019   11:10 AM  Fall Risk   Falls in the past year? 0 0 0 0 0  Comment     Emmi Telephone Survey: data to providers prior to load  Number falls in past yr: 0  0 0   Injury with Fall? 0  0 0   Risk for fall due to : No Fall Risks  No Fall Risks Medication side effect   Follow up Falls prevention discussed;Falls evaluation completed  Falls evaluation completed;Falls prevention discussed Falls evaluation  completed;Falls prevention discussed     FALL RISK PREVENTION PERTAINING TO THE HOME:  Any stairs in or around the home? Yes  If so, are there any without handrails? No  Home free of loose throw rugs in walkways, pet beds, electrical cords, etc? Yes  Adequate lighting in your home to reduce risk of falls? Yes   ASSISTIVE DEVICES UTILIZED TO PREVENT FALLS:  Life alert? No  Use of a cane,  walker or w/c? No  Grab bars in the bathroom? No  Shower chair or bench in shower? No  Elevated toilet seat or a handicapped toilet? Yes    Cognitive Function:    09/14/2020   10:38 AM 07/28/2019   10:55 AM 05/27/2018    8:52 AM 02/13/2017    1:24 PM 01/03/2016   11:18 AM  MMSE - Mini Mental State Exam  Not completed: Refused      Orientation to time  '5 5 5 5  '$ Orientation to Place  '5 5 5 5  '$ Registration  '3 3 3 3  '$ Attention/ Calculation  5 0 0 0  Recall  '3 3 3 3  '$ Language- name 2 objects   0 0 0  Language- repeat  '1 1 1 1  '$ Language- follow 3 step command   '3 3 3  '$ Language- read & follow direction   0 0 0  Write a sentence   0 0 0  Copy design   0 0 0  Total score   '20 20 20        '$ 09/20/2022    8:25 AM  6CIT Screen  What Year? 0 points  What month? 0 points  What time? 0 points  Count back from 20 0 points  Months in reverse 0 points  Repeat phrase 0 points  Total Score 0 points    Immunizations Immunization History  Administered Date(s) Administered   Influenza,inj,Quad PF,6+ Mos 05/15/2013, 06/07/2014, 05/11/2015   Pneumococcal Conjugate-13 12/09/2013   Pneumococcal Polysaccharide-23 06/08/2004, 05/07/2012   Td 12/04/2012   Zoster, Live 12/04/2012    TDAP status: Up to date  Flu Vaccine status: Declined, Education has been provided regarding the importance of this vaccine but patient still declined. Advised may receive this vaccine at local pharmacy or Health Dept. Aware to provide a copy of the vaccination record if obtained from local pharmacy or Health Dept. Verbalized acceptance and understanding.  Pneumococcal vaccine status: Up to date  Covid-19 vaccine status: Declined, Education has been provided regarding the importance of this vaccine but patient still declined. Advised may receive this vaccine at local pharmacy or Health Dept.or vaccine clinic. Aware to provide a copy of the vaccination record if obtained from local pharmacy or Health Dept. Verbalized  acceptance and understanding.  Qualifies for Shingles Vaccine? Yes   Zostavax completed Yes   Shingrix Completed?: No.    Education has been provided regarding the importance of this vaccine. Patient has been advised to call insurance company to determine out of pocket expense if they have not yet received this vaccine. Advised may also receive vaccine at local pharmacy or Health Dept. Verbalized acceptance and understanding.  Screening Tests Health Maintenance  Topic Date Due   COVID-19 Vaccine (1) Never done   Zoster Vaccines- Shingrix (1 of 2) Never done   INFLUENZA VACCINE  03/27/2022   DTaP/Tdap/Td (2 - Tdap) 12/05/2022   MAMMOGRAM  05/22/2023   Medicare Annual Wellness (AWV)  09/21/2023   COLONOSCOPY (Pts 45-67yr Insurance coverage will need to  be confirmed)  08/30/2024   Pneumonia Vaccine 37+ Years old  Completed   DEXA SCAN  Completed   Hepatitis C Screening  Completed   HPV VACCINES  Aged Out    Health Maintenance  Health Maintenance Due  Topic Date Due   COVID-19 Vaccine (1) Never done   Zoster Vaccines- Shingrix (1 of 2) Never done   INFLUENZA VACCINE  03/27/2022    Colorectal cancer screening: Type of screening: Colonoscopy. Completed 08/31/19. Repeat every 5 years  Mammogram status: Completed 05/21/22. Repeat every year  Bone Density status: Completed 05/11/20. Results reflect: Bone density results: OSTEOPENIA. Repeat every 5 years.  Lung Cancer Screening: (Low Dose CT Chest recommended if Age 65-80 years, 30 pack-year currently smoking OR have quit w/in 15years.) does not qualify.   Additional Screening:  Hepatitis C Screening: does qualify; Completed 05/11/15  Vision Screening: Recommended annual ophthalmology exams for early detection of glaucoma and other disorders of the eye. Is the patient up to date with their annual eye exam?  Yes  Who is the provider or what is the name of the office in which the patient attends annual eye exams? [facility]  If pt is  not established with a provider, would they like to be referred to a provider to establish care? No .   Dental Screening: Recommended annual dental exams for proper oral hygiene  Community Resource Referral / Chronic Care Management: CRR required this visit?  No   CCM required this visit?  No      Plan:     I have personally reviewed and noted the following in the patient's chart:   Medical and social history Use of alcohol, tobacco or illicit drugs  Current medications and supplements including opioid prescriptions. Patient is not currently taking opioid prescriptions. Functional ability and status Nutritional status Physical activity Advanced directives List of other physicians Hospitalizations, surgeries, and ER visits in previous 12 months Vitals Screenings to include cognitive, depression, and falls Referrals and appointments  In addition, I have reviewed and discussed with patient certain preventive protocols, quality metrics, and best practice recommendations. A written personalized care plan for preventive services as well as general preventive health recommendations were provided to patient.     Dionisio David, LPN   [id number]   Nurse Notes: none

## 2022-09-20 NOTE — Patient Instructions (Signed)
Victoria Morrison , Thank you for taking time to come for your Medicare Wellness Visit. I appreciate your ongoing commitment to your health goals. Please review the following plan we discussed and let me know if I can assist you in the future.   These are the goals we discussed:  Goals      DIET - EAT MORE FRUITS AND VEGETABLES     Increase physical activity     Starting 05/27/2018, I will continue to walk 1-2 miles daily.      Patient Stated     07/28/2019, I will try to work on losing some weight.     Patient Stated     09/14/2020, I will continue to walk 2 days a week for about 30 minutes.        This is a list of the screening recommended for you and due dates:  Health Maintenance  Topic Date Due   COVID-19 Vaccine (1) Never done   Zoster (Shingles) Vaccine (1 of 2) Never done   Flu Shot  03/27/2022   DTaP/Tdap/Td vaccine (2 - Tdap) 12/05/2022   Mammogram  05/22/2023   Medicare Annual Wellness Visit  09/21/2023   Colon Cancer Screening  08/30/2024   Pneumonia Vaccine  Completed   DEXA scan (bone density measurement)  Completed   Hepatitis C Screening: USPSTF Recommendation to screen - Ages 64-79 yo.  Completed   HPV Vaccine  Aged Out    Advanced directives: yes  Conditions/risks identified: no  Next appointment: Follow up in one year for your annual wellness visit 09/23/23 @ 11:15 am by phone   Preventive Care 65 Years and Older, Female Preventive care refers to lifestyle choices and visits with your health care provider that can promote health and wellness. What does preventive care include? A yearly physical exam. This is also called an annual well check. Dental exams once or twice a year. Routine eye exams. Ask your health care provider how often you should have your eyes checked. Personal lifestyle choices, including: Daily care of your teeth and gums. Regular physical activity. Eating a healthy diet. Avoiding tobacco and drug use. Limiting alcohol use. Practicing  safe sex. Taking low-dose aspirin every day. Taking vitamin and mineral supplements as recommended by your health care provider. What happens during an annual well check? The services and screenings done by your health care provider during your annual well check will depend on your age, overall health, lifestyle risk factors, and family history of disease. Counseling  Your health care provider may ask you questions about your: Alcohol use. Tobacco use. Drug use. Emotional well-being. Home and relationship well-being. Sexual activity. Eating habits. History of falls. Memory and ability to understand (cognition). Work and work Statistician. Reproductive health. Screening  You may have the following tests or measurements: Height, weight, and BMI. Blood pressure. Lipid and cholesterol levels. These may be checked every 5 years, or more frequently if you are over 74 years old. Skin check. Lung cancer screening. You may have this screening every year starting at age 21 if you have a 30-pack-year history of smoking and currently smoke or have quit within the past 15 years. Fecal occult blood test (FOBT) of the stool. You may have this test every year starting at age 10. Flexible sigmoidoscopy or colonoscopy. You may have a sigmoidoscopy every 5 years or a colonoscopy every 10 years starting at age 23. Hepatitis C blood test. Hepatitis B blood test. Sexually transmitted disease (STD) testing. Diabetes screening. This is  done by checking your blood sugar (glucose) after you have not eaten for a while (fasting). You may have this done every 1-3 years. Bone density scan. This is done to screen for osteoporosis. You may have this done starting at age 67. Mammogram. This may be done every 1-2 years. Talk to your health care provider about how often you should have regular mammograms. Talk with your health care provider about your test results, treatment options, and if necessary, the need for more  tests. Vaccines  Your health care provider may recommend certain vaccines, such as: Influenza vaccine. This is recommended every year. Tetanus, diphtheria, and acellular pertussis (Tdap, Td) vaccine. You may need a Td booster every 10 years. Zoster vaccine. You may need this after age 54. Pneumococcal 13-valent conjugate (PCV13) vaccine. One dose is recommended after age 39. Pneumococcal polysaccharide (PPSV23) vaccine. One dose is recommended after age 71. Talk to your health care provider about which screenings and vaccines you need and how often you need them. This information is not intended to replace advice given to you by your health care provider. Make sure you discuss any questions you have with your health care provider. Document Released: 09/09/2015 Document Revised: 05/02/2016 Document Reviewed: 06/14/2015 Elsevier Interactive Patient Education  2017 [place] Prevention in the Home Falls can cause injuries. They can happen to people of all ages. There are many things you can do to make your home safe and to help prevent falls. What can I do on the outside of my home? Regularly fix the edges of walkways and driveways and fix any cracks. Remove anything that might make you trip as you walk through a door, such as a raised step or threshold. Trim any bushes or trees on the path to your home. Use bright outdoor lighting. Clear any walking paths of anything that might make someone trip, such as rocks or tools. Regularly check to see if handrails are loose or broken. Make sure that both sides of any steps have handrails. Any raised decks and porches should have guardrails on the edges. Have any leaves, snow, or ice cleared regularly. Use sand or salt on walking paths during winter. Clean up any spills in your garage right away. This includes oil or grease spills. What can I do in the bathroom? Use night lights. Install grab bars by the toilet and in the tub and shower.  Do not use towel bars as grab bars. Use non-skid mats or decals in the tub or shower. If you need to sit down in the shower, use a plastic, non-slip stool. Keep the floor dry. Clean up any water that spills on the floor as soon as it happens. Remove soap buildup in the tub or shower regularly. Attach bath mats securely with double-sided non-slip rug tape. Do not have throw rugs and other things on the floor that can make you trip. What can I do in the bedroom? Use night lights. Make sure that you have a light by your bed that is easy to reach. Do not use any sheets or blankets that are too big for your bed. They should not hang down onto the floor. Have a firm chair that has side arms. You can use this for support while you get dressed. Do not have throw rugs and other things on the floor that can make you trip. What can I do in the kitchen? Clean up any spills right away. Avoid walking on wet floors. Keep items that you  use a lot in easy-to-reach places. If you need to reach something above you, use a strong step stool that has a grab bar. Keep electrical cords out of the way. Do not use floor polish or wax that makes floors slippery. If you must use wax, use non-skid floor wax. Do not have throw rugs and other things on the floor that can make you trip. What can I do with my stairs? Do not leave any items on the stairs. Make sure that there are handrails on both sides of the stairs and use them. Fix handrails that are broken or loose. Make sure that handrails are as long as the stairways. Check any carpeting to make sure that it is firmly attached to the stairs. Fix any carpet that is loose or worn. Avoid having throw rugs at the top or bottom of the stairs. If you do have throw rugs, attach them to the floor with carpet tape. Make sure that you have a light switch at the top of the stairs and the bottom of the stairs. If you do not have them, ask someone to add them for you. What else  can I do to help prevent falls? Wear shoes that: Do not have high heels. Have rubber bottoms. Are comfortable and fit you well. Are closed at the toe. Do not wear sandals. If you use a stepladder: Make sure that it is fully opened. Do not climb a closed stepladder. Make sure that both sides of the stepladder are locked into place. Ask someone to hold it for you, if possible. Clearly mark and make sure that you can see: Any grab bars or handrails. First and last steps. Where the edge of each step is. Use tools that help you move around (mobility aids) if they are needed. These include: Canes. Walkers. Scooters. Crutches. Turn on the lights when you go into a dark area. Replace any light bulbs as soon as they burn out. Set up your furniture so you have a clear path. Avoid moving your furniture around. If any of your floors are uneven, fix them. If there are any pets around you, be aware of where they are. Review your medicines with your doctor. Some medicines can make you feel dizzy. This can increase your chance of falling. Ask your doctor what other things that you can do to help prevent falls. This information is not intended to replace advice given to you by your health care provider. Make sure you discuss any questions you have with your health care provider. Document Released: 06/09/2009 Document Revised: 01/19/2016 Document Reviewed: 09/17/2014 Elsevier Interactive Patient Education  2017 [employer].

## 2022-09-21 ENCOUNTER — Encounter: Payer: Self-pay | Admitting: Family Medicine

## 2022-09-21 ENCOUNTER — Ambulatory Visit (INDEPENDENT_AMBULATORY_CARE_PROVIDER_SITE_OTHER): Payer: Medicare Other | Admitting: Family Medicine

## 2022-09-21 VITALS — BP 120/74 | HR 63 | Temp 97.9°F | Ht 62.5 in | Wt 166.2 lb

## 2022-09-21 DIAGNOSIS — E785 Hyperlipidemia, unspecified: Secondary | ICD-10-CM

## 2022-09-21 DIAGNOSIS — Z0000 Encounter for general adult medical examination without abnormal findings: Secondary | ICD-10-CM

## 2022-09-21 DIAGNOSIS — I10 Essential (primary) hypertension: Secondary | ICD-10-CM

## 2022-09-21 DIAGNOSIS — M85831 Other specified disorders of bone density and structure, right forearm: Secondary | ICD-10-CM | POA: Diagnosis not present

## 2022-09-21 DIAGNOSIS — Z7189 Other specified counseling: Secondary | ICD-10-CM | POA: Insufficient documentation

## 2022-09-21 MED ORDER — AMLODIPINE BESYLATE 5 MG PO TABS: 5.0000 mg | ORAL_TABLET | Freq: Every day | ORAL | 4 refills | Status: DC

## 2022-09-21 MED ORDER — PRAVASTATIN SODIUM 40 MG PO TABS: 40.0000 mg | ORAL_TABLET | Freq: Every day | ORAL | 4 refills | Status: DC

## 2022-09-21 NOTE — Patient Instructions (Signed)
You are doing well today Retry pravastatin '40mg'$  daily sent to pharmacy. Return as needed or in 1 year for next physical.

## 2022-09-21 NOTE — Assessment & Plan Note (Signed)
Stable period on doterra supplementation. Consider updated DEXA 2026

## 2022-09-21 NOTE — Assessment & Plan Note (Signed)
Chronic, stable. Continue current regimen. 

## 2022-09-21 NOTE — Assessment & Plan Note (Deleted)
Chronic, stable. Continue current regimen. 

## 2022-09-21 NOTE — Assessment & Plan Note (Signed)
Chronic, deteriorated on once weekly pravastatin. Reviewed ASCVD risk. Discussed options - daily statin vs zetia vs bempedoic acid - she desires to restart daily pravastatin as she previously tolerated well.

## 2022-09-21 NOTE — Assessment & Plan Note (Signed)
Preventative protocols reviewed and updated unless pt declined. Discussed healthy diet and lifestyle.  

## 2022-09-21 NOTE — Progress Notes (Signed)
Patient ID: Victoria Morrison, female    DOB: April 08, 1946, 77 y.o.   MRN: [id number]  This visit was conducted in person.  BP 120/74   Pulse 63   Temp 97.9 F (36.6 C) (Temporal)   Ht 5' 2.5" (1.588 m)   Wt 166 lb 4 oz (75.4 kg)   SpO2 97%   BMI 29.92 kg/m    CC: CPE Subjective:   HPI: Victoria Morrison is a 77 y.o. female presenting on 09/21/2022 for Annual Exam (MCR prt 2 [AWV- 09/20/22]. )   Saw health advisor yesterday for medicare wellness visit. Note reviewed.   No results found.  [place] Office Visit from 09/21/2022 in [facility] [place]  PHQ-2 Total Score 0          09/21/2022    9:54 AM 09/20/2022    8:20 AM 09/19/2021   10:05 AM 09/14/2020   10:36 AM 07/28/2019   10:50 AM  Fall Risk   Falls in the past year? 0 0 0 0 0  Number falls in past yr:  0  0 0  Injury with Fall?  0  0 0  Risk for fall due to :  No Fall Risks  No Fall Risks Medication side effect  Follow up  Falls prevention discussed;Falls evaluation completed  Falls evaluation completed;Falls prevention discussed Falls evaluation completed;Falls prevention discussed    Has family in [place], [place], and [place] as well as local daughter who lives 1 block away - she has moved in with her.   Upcoming audiology appt   Regular with Doterra supplements - taking about 2000+ IU vit D.   10 lb weight loss - healthier eating, going to gym regularly.   Hyperlipidemia - chronic, she previously dropped pravastatin to once weekly dosing and desires to come off completely. Father with CAD/MI age 41.  The 10-year ASCVD risk score (Arnett DK, et al., 2019) is: 19.2%   Values used to calculate the score:     Age: 77 years     Sex: Female     Is Non-Hispanic African American: No     Diabetic: No     Tobacco smoker: No     Systolic Blood Pressure: 742 mmHg     Is BP treated: Yes     HDL Cholesterol: 71.1 mg/dL     Total Cholesterol: 257 mg/dL    Preventative: COLONOSCOPY 08/2019 - 3 HP, 1  TA, mod diverticulosis rpt 5 yrs [facility]) Well woman - last 01/2017, normal with absent endocervical zone due to atrophy. Always normal paps in past. Will stop cervical cancer screening.  [facility] Gets yearly due to fmhx. DEXA Date: 12/2012 T score -1.3 femur, -1.9 radius.  DEXA 04/2020: R forearm -1.8, RFN -1.3. Takes bone supplement from [facility]. Walks regulary.  Lung cancer screening - not eligible.  Flu shot - declined  COVID vaccine - declined Tetanus - 11/2012  Pneumovax - 05/07/2012, prevnar-13 11/2013  Zostavax - 11/2012  RSV - declines Shingrix - discussed, declines Advanced directives - scanned 12/2015. HCPOA is daughter Darrick Penna) then son Joneen Caraway). Living will in chart.  Seat belt use discussed  Sunscreen use discussed, no changing moles on skin.  Sleep - averaging 7 hours/night Non smoker  Alcohol - occasional  Dentist Q6 mo  Eye exam yearly  Bowel - no diarrhea/constipation  Bladder - no incontinence  G4P4   Widower - husband [name] passed away 2019-06-21 from [place]  at [facility]  Grown children, 3 sons, 1 daughter, 10 grandchildren Occupation: retired, was Optician, dispensing Edu: college Activity: walks regularly, active at [place]: good water, vegetables daily      Relevant past medical, surgical, family and social history reviewed and updated as indicated. Interim medical history since our last visit reviewed. Allergies and medications reviewed and updated. Outpatient Medications Prior to Visit  Medication Sig Dispense Refill   NON FORMULARY DoTerra Lifelong Vitality Supplement     amLODipine (NORVASC) 5 MG tablet Take 1 tablet (5 mg total) by mouth daily. 90 tablet 3   pravastatin (PRAVACHOL) 40 MG tablet Take 1 tablet (40 mg total) by mouth once a week.     No facility-administered medications prior to visit.     Per HPI unless specifically indicated in ROS section below Review of Systems  Constitutional:   Negative for activity change, appetite change, chills, fatigue, fever and unexpected weight change.  HENT:  Negative for hearing loss.   Eyes:  Negative for visual disturbance.  Respiratory:  Negative for cough, chest tightness, shortness of breath and wheezing.   Cardiovascular:  Negative for chest pain, palpitations and leg swelling.  Gastrointestinal:  Negative for abdominal distention, abdominal pain, blood in stool, constipation, diarrhea, nausea and vomiting.  Genitourinary:  Negative for difficulty urinating and hematuria.  Musculoskeletal:  Negative for arthralgias, myalgias and neck pain.  Skin:  Negative for rash.  Neurological:  Negative for dizziness, seizures, syncope and headaches.  Hematological:  Negative for adenopathy. Does not bruise/bleed easily.  Psychiatric/Behavioral:  Negative for dysphoric mood. The patient is not nervous/anxious.     Objective:  BP 120/74   Pulse 63   Temp 97.9 F (36.6 C) (Temporal)   Ht 5' 2.5" (1.588 m)   Wt 166 lb 4 oz (75.4 kg)   SpO2 97%   BMI 29.92 kg/m   Wt Readings from Last 3 Encounters:  09/21/22 166 lb 4 oz (75.4 kg)  09/20/22 176 lb (79.8 kg)  09/19/21 176 lb 9 oz (80.1 kg)      Physical Exam Vitals and nursing note reviewed.  Constitutional:      Appearance: Normal appearance. She is not ill-appearing.  HENT:     Head: Normocephalic and atraumatic.     Right Ear: Tympanic membrane, ear canal and external ear normal. There is no impacted cerumen.     Left Ear: Tympanic membrane, ear canal and external ear normal. There is no impacted cerumen.  Eyes:     General:        Right eye: No discharge.        Left eye: No discharge.     Extraocular Movements: Extraocular movements intact.     Conjunctiva/sclera: Conjunctivae normal.     Pupils: Pupils are equal, round, and reactive to light.  Neck:     Thyroid: No thyroid mass or thyromegaly.  Cardiovascular:     Rate and Rhythm: Normal rate and regular rhythm.      Pulses: Normal pulses.     Heart sounds: Normal heart sounds. No murmur heard. Pulmonary:     Effort: Pulmonary effort is normal. No respiratory distress.     Breath sounds: Normal breath sounds. No wheezing, rhonchi or rales.  Abdominal:     General: Bowel sounds are normal. There is no distension.     Palpations: Abdomen is soft. There is no mass.     Tenderness: There is no abdominal tenderness. There is no guarding or  rebound.     Hernia: No hernia is present.  Musculoskeletal:     Cervical back: Normal range of motion and neck supple. No rigidity.     Right lower leg: No edema.     Left lower leg: No edema.  Lymphadenopathy:     Cervical: No cervical adenopathy.  Skin:    General: Skin is warm and dry.     Findings: No rash.  Neurological:     General: No focal deficit present.     Mental Status: She is alert. Mental status is at baseline.  Psychiatric:        Mood and Affect: Mood normal.        Behavior: Behavior normal.       Results for orders placed or performed in visit on 09/12/22  TSH  Result Value Ref Range   TSH 2.48 0.35 - 5.50 uIU/mL  Microalbumin / creatinine urine ratio  Result Value Ref Range   Microalb, Ur 1.1 0.0 - 1.9 mg/dL   Creatinine,U 69.7 mg/dL   Microalb Creat Ratio 1.5 0.0 - 30.0 mg/g  VITAMIN D 25 Hydroxy (Vit-D Deficiency, Fractures)  Result Value Ref Range   VITD 40.57 30.00 - 100.00 ng/mL  Comprehensive metabolic panel  Result Value Ref Range   Sodium 141 135 - 145 mEq/L   Potassium 4.2 3.5 - 5.1 mEq/L   Chloride 104 96 - 112 mEq/L   CO2 29 19 - 32 mEq/L   Glucose, Bld 92 70 - 99 mg/dL   BUN 15 6 - 23 mg/dL   Creatinine, Ser 0.77 0.40 - 1.20 mg/dL   Total Bilirubin 0.7 0.2 - 1.2 mg/dL   Alkaline Phosphatase 76 39 - 117 U/L   AST 15 0 - 37 U/L   ALT 14 0 - 35 U/L   Total Protein 6.8 6.0 - 8.3 g/dL   Albumin 4.3 3.5 - 5.2 g/dL   GFR 75.31 >60.00 mL/min   Calcium 9.5 8.4 - 10.5 mg/dL  Lipid panel  Result Value Ref Range    Cholesterol 257 (H) 0 - 200 mg/dL   Triglycerides 116.0 0.0 - 149.0 mg/dL   HDL 71.10 >39.00 mg/dL   VLDL 23.2 0.0 - 40.0 mg/dL   LDL Cholesterol 163 (H) 0 - 99 mg/dL   Total CHOL/HDL Ratio 4    NonHDL 186.37     Assessment & Plan:   Problem List Items Addressed This Visit     Routine history and physical examination of adult - Primary (Chronic)    Preventative protocols reviewed and updated unless pt declined. Discussed healthy diet and lifestyle.       HTN (hypertension)    Chronic, stable. Continue current regimen.       Relevant Medications   amLODipine (NORVASC) 5 MG tablet   pravastatin (PRAVACHOL) 40 MG tablet   HLD (hyperlipidemia)    Chronic, deteriorated on once weekly pravastatin. Reviewed ASCVD risk. Discussed options - daily statin vs zetia vs bempedoic acid - she desires to restart daily pravastatin as she previously tolerated well.      Relevant Medications   amLODipine (NORVASC) 5 MG tablet   pravastatin (PRAVACHOL) 40 MG tablet   Other Relevant Orders   Lipid panel   Comprehensive metabolic panel   Osteopenia    Stable period on doterra supplementation. Consider updated DEXA 2026        Meds ordered this encounter  Medications   amLODipine (NORVASC) 5 MG tablet    Sig: Take 1 tablet (  5 mg total) by mouth daily.    Dispense:  90 tablet    Refill:  4   pravastatin (PRAVACHOL) 40 MG tablet    Sig: Take 1 tablet (40 mg total) by mouth daily.    Dispense:  90 tablet    Refill:  4    Note new sig    Orders Placed This Encounter  Procedures   Lipid panel    Standing Status:   Future    Standing Expiration Date:   09/22/2023   Comprehensive metabolic panel    Standing Status:   Future    Standing Expiration Date:   09/22/2023    Patient Instructions  You are doing well today Retry pravastatin '40mg'$  daily sent to pharmacy. Return as needed or in 1 year for next physical.  Follow up plan: Return for annual exam, prior fasting for blood  work.  Ria Bush, MD

## 2022-09-24 DIAGNOSIS — H90A21 Sensorineural hearing loss, unilateral, right ear, with restricted hearing on the contralateral side: Secondary | ICD-10-CM | POA: Diagnosis not present

## 2022-10-01 DIAGNOSIS — H43813 Vitreous degeneration, bilateral: Secondary | ICD-10-CM | POA: Diagnosis not present

## 2022-10-12 ENCOUNTER — Other Ambulatory Visit: Payer: Self-pay | Admitting: Otolaryngology

## 2022-10-12 DIAGNOSIS — H90A21 Sensorineural hearing loss, unilateral, right ear, with restricted hearing on the contralateral side: Secondary | ICD-10-CM

## 2022-10-15 DIAGNOSIS — M9903 Segmental and somatic dysfunction of lumbar region: Secondary | ICD-10-CM | POA: Diagnosis not present

## 2022-10-15 DIAGNOSIS — M5416 Radiculopathy, lumbar region: Secondary | ICD-10-CM | POA: Diagnosis not present

## 2022-10-15 DIAGNOSIS — M62830 Muscle spasm of back: Secondary | ICD-10-CM | POA: Diagnosis not present

## 2022-10-15 DIAGNOSIS — M9904 Segmental and somatic dysfunction of sacral region: Secondary | ICD-10-CM | POA: Diagnosis not present

## 2022-10-16 DIAGNOSIS — M9903 Segmental and somatic dysfunction of lumbar region: Secondary | ICD-10-CM | POA: Diagnosis not present

## 2022-10-16 DIAGNOSIS — M62830 Muscle spasm of back: Secondary | ICD-10-CM | POA: Diagnosis not present

## 2022-10-16 DIAGNOSIS — M5416 Radiculopathy, lumbar region: Secondary | ICD-10-CM | POA: Diagnosis not present

## 2022-10-16 DIAGNOSIS — M9904 Segmental and somatic dysfunction of sacral region: Secondary | ICD-10-CM | POA: Diagnosis not present

## 2022-10-18 DIAGNOSIS — M9903 Segmental and somatic dysfunction of lumbar region: Secondary | ICD-10-CM | POA: Diagnosis not present

## 2022-10-18 DIAGNOSIS — M5416 Radiculopathy, lumbar region: Secondary | ICD-10-CM | POA: Diagnosis not present

## 2022-10-18 DIAGNOSIS — M62830 Muscle spasm of back: Secondary | ICD-10-CM | POA: Diagnosis not present

## 2022-10-18 DIAGNOSIS — M9904 Segmental and somatic dysfunction of sacral region: Secondary | ICD-10-CM | POA: Diagnosis not present

## 2022-10-22 DIAGNOSIS — M9903 Segmental and somatic dysfunction of lumbar region: Secondary | ICD-10-CM | POA: Diagnosis not present

## 2022-10-22 DIAGNOSIS — M5416 Radiculopathy, lumbar region: Secondary | ICD-10-CM | POA: Diagnosis not present

## 2022-10-22 DIAGNOSIS — M9904 Segmental and somatic dysfunction of sacral region: Secondary | ICD-10-CM | POA: Diagnosis not present

## 2022-10-22 DIAGNOSIS — M62830 Muscle spasm of back: Secondary | ICD-10-CM | POA: Diagnosis not present

## 2022-10-23 DIAGNOSIS — M9903 Segmental and somatic dysfunction of lumbar region: Secondary | ICD-10-CM | POA: Diagnosis not present

## 2022-10-23 DIAGNOSIS — M9904 Segmental and somatic dysfunction of sacral region: Secondary | ICD-10-CM | POA: Diagnosis not present

## 2022-10-23 DIAGNOSIS — M5416 Radiculopathy, lumbar region: Secondary | ICD-10-CM | POA: Diagnosis not present

## 2022-10-23 DIAGNOSIS — M62830 Muscle spasm of back: Secondary | ICD-10-CM | POA: Diagnosis not present

## 2022-10-25 DIAGNOSIS — M9903 Segmental and somatic dysfunction of lumbar region: Secondary | ICD-10-CM | POA: Diagnosis not present

## 2022-10-25 DIAGNOSIS — M9904 Segmental and somatic dysfunction of sacral region: Secondary | ICD-10-CM | POA: Diagnosis not present

## 2022-10-25 DIAGNOSIS — M62830 Muscle spasm of back: Secondary | ICD-10-CM | POA: Diagnosis not present

## 2022-10-25 DIAGNOSIS — M5416 Radiculopathy, lumbar region: Secondary | ICD-10-CM | POA: Diagnosis not present

## 2022-10-29 ENCOUNTER — Other Ambulatory Visit: Payer: Medicare Other

## 2022-11-01 DIAGNOSIS — M5416 Radiculopathy, lumbar region: Secondary | ICD-10-CM | POA: Diagnosis not present

## 2022-11-01 DIAGNOSIS — M9903 Segmental and somatic dysfunction of lumbar region: Secondary | ICD-10-CM | POA: Diagnosis not present

## 2022-11-01 DIAGNOSIS — M9904 Segmental and somatic dysfunction of sacral region: Secondary | ICD-10-CM | POA: Diagnosis not present

## 2022-11-01 DIAGNOSIS — M62830 Muscle spasm of back: Secondary | ICD-10-CM | POA: Diagnosis not present

## 2022-11-02 DIAGNOSIS — M9904 Segmental and somatic dysfunction of sacral region: Secondary | ICD-10-CM | POA: Diagnosis not present

## 2022-11-02 DIAGNOSIS — M5416 Radiculopathy, lumbar region: Secondary | ICD-10-CM | POA: Diagnosis not present

## 2022-11-02 DIAGNOSIS — M62830 Muscle spasm of back: Secondary | ICD-10-CM | POA: Diagnosis not present

## 2022-11-02 DIAGNOSIS — M9903 Segmental and somatic dysfunction of lumbar region: Secondary | ICD-10-CM | POA: Diagnosis not present

## 2022-11-06 DIAGNOSIS — M9903 Segmental and somatic dysfunction of lumbar region: Secondary | ICD-10-CM | POA: Diagnosis not present

## 2022-11-06 DIAGNOSIS — M9904 Segmental and somatic dysfunction of sacral region: Secondary | ICD-10-CM | POA: Diagnosis not present

## 2022-11-06 DIAGNOSIS — M62830 Muscle spasm of back: Secondary | ICD-10-CM | POA: Diagnosis not present

## 2022-11-06 DIAGNOSIS — M5416 Radiculopathy, lumbar region: Secondary | ICD-10-CM | POA: Diagnosis not present

## 2022-11-08 ENCOUNTER — Ambulatory Visit
Admission: RE | Admit: 2022-11-08 | Discharge: 2022-11-08 | Disposition: A | Discharge status: Home / Self Care | Payer: Medicare Other | Source: Ambulatory Visit | Attending: Otolaryngology | Admitting: Otolaryngology

## 2022-11-08 DIAGNOSIS — H90A21 Sensorineural hearing loss, unilateral, right ear, with restricted hearing on the contralateral side: Secondary | ICD-10-CM | POA: Diagnosis not present

## 2022-11-08 MED ORDER — GADOPICLENOL 0.5 MMOL/ML IV SOLN
7.5000 mL | Freq: Once | INTRAVENOUS | Status: AC | PRN
  Administered 2022-11-08: 7.5 mL via INTRAVENOUS

## 2022-11-09 DIAGNOSIS — M5416 Radiculopathy, lumbar region: Secondary | ICD-10-CM | POA: Diagnosis not present

## 2022-11-09 DIAGNOSIS — M9903 Segmental and somatic dysfunction of lumbar region: Secondary | ICD-10-CM | POA: Diagnosis not present

## 2022-11-09 DIAGNOSIS — M9904 Segmental and somatic dysfunction of sacral region: Secondary | ICD-10-CM | POA: Diagnosis not present

## 2022-11-09 DIAGNOSIS — M62830 Muscle spasm of back: Secondary | ICD-10-CM | POA: Diagnosis not present

## 2022-11-12 ENCOUNTER — Encounter: Payer: Self-pay | Admitting: Family Medicine

## 2022-11-13 ENCOUNTER — Other Ambulatory Visit: Payer: Medicare Other

## 2022-11-13 DIAGNOSIS — M9904 Segmental and somatic dysfunction of sacral region: Secondary | ICD-10-CM | POA: Diagnosis not present

## 2022-11-13 DIAGNOSIS — M9903 Segmental and somatic dysfunction of lumbar region: Secondary | ICD-10-CM | POA: Diagnosis not present

## 2022-11-13 DIAGNOSIS — M62830 Muscle spasm of back: Secondary | ICD-10-CM | POA: Diagnosis not present

## 2022-11-13 DIAGNOSIS — M5416 Radiculopathy, lumbar region: Secondary | ICD-10-CM | POA: Diagnosis not present

## 2022-11-27 DIAGNOSIS — M62830 Muscle spasm of back: Secondary | ICD-10-CM | POA: Diagnosis not present

## 2022-11-27 DIAGNOSIS — M9904 Segmental and somatic dysfunction of sacral region: Secondary | ICD-10-CM | POA: Diagnosis not present

## 2022-11-27 DIAGNOSIS — M9903 Segmental and somatic dysfunction of lumbar region: Secondary | ICD-10-CM | POA: Diagnosis not present

## 2022-11-27 DIAGNOSIS — M5416 Radiculopathy, lumbar region: Secondary | ICD-10-CM | POA: Diagnosis not present

## 2022-11-30 DIAGNOSIS — H903 Sensorineural hearing loss, bilateral: Secondary | ICD-10-CM | POA: Diagnosis not present

## 2022-12-11 DIAGNOSIS — M9903 Segmental and somatic dysfunction of lumbar region: Secondary | ICD-10-CM | POA: Diagnosis not present

## 2022-12-11 DIAGNOSIS — M9904 Segmental and somatic dysfunction of sacral region: Secondary | ICD-10-CM | POA: Diagnosis not present

## 2022-12-11 DIAGNOSIS — M62830 Muscle spasm of back: Secondary | ICD-10-CM | POA: Diagnosis not present

## 2022-12-11 DIAGNOSIS — M5416 Radiculopathy, lumbar region: Secondary | ICD-10-CM | POA: Diagnosis not present

## 2023-01-01 DIAGNOSIS — M9903 Segmental and somatic dysfunction of lumbar region: Secondary | ICD-10-CM | POA: Diagnosis not present

## 2023-01-01 DIAGNOSIS — M5416 Radiculopathy, lumbar region: Secondary | ICD-10-CM | POA: Diagnosis not present

## 2023-01-01 DIAGNOSIS — M9904 Segmental and somatic dysfunction of sacral region: Secondary | ICD-10-CM | POA: Diagnosis not present

## 2023-01-01 DIAGNOSIS — M62830 Muscle spasm of back: Secondary | ICD-10-CM | POA: Diagnosis not present

## 2023-01-29 DIAGNOSIS — M5416 Radiculopathy, lumbar region: Secondary | ICD-10-CM | POA: Diagnosis not present

## 2023-01-29 DIAGNOSIS — M9903 Segmental and somatic dysfunction of lumbar region: Secondary | ICD-10-CM | POA: Diagnosis not present

## 2023-01-29 DIAGNOSIS — M9904 Segmental and somatic dysfunction of sacral region: Secondary | ICD-10-CM | POA: Diagnosis not present

## 2023-01-29 DIAGNOSIS — M62830 Muscle spasm of back: Secondary | ICD-10-CM | POA: Diagnosis not present

## 2023-02-26 DIAGNOSIS — M62830 Muscle spasm of back: Secondary | ICD-10-CM | POA: Diagnosis not present

## 2023-02-26 DIAGNOSIS — M9903 Segmental and somatic dysfunction of lumbar region: Secondary | ICD-10-CM | POA: Diagnosis not present

## 2023-02-26 DIAGNOSIS — M9904 Segmental and somatic dysfunction of sacral region: Secondary | ICD-10-CM | POA: Diagnosis not present

## 2023-02-26 DIAGNOSIS — M5416 Radiculopathy, lumbar region: Secondary | ICD-10-CM | POA: Diagnosis not present

## 2023-03-07 DIAGNOSIS — K1329 Other disturbances of oral epithelium, including tongue: Secondary | ICD-10-CM | POA: Diagnosis not present

## 2023-03-26 DIAGNOSIS — M62830 Muscle spasm of back: Secondary | ICD-10-CM | POA: Diagnosis not present

## 2023-03-26 DIAGNOSIS — M9903 Segmental and somatic dysfunction of lumbar region: Secondary | ICD-10-CM | POA: Diagnosis not present

## 2023-03-26 DIAGNOSIS — M5416 Radiculopathy, lumbar region: Secondary | ICD-10-CM | POA: Diagnosis not present

## 2023-03-26 DIAGNOSIS — M9904 Segmental and somatic dysfunction of sacral region: Secondary | ICD-10-CM | POA: Diagnosis not present

## 2023-04-17 DIAGNOSIS — M9904 Segmental and somatic dysfunction of sacral region: Secondary | ICD-10-CM | POA: Diagnosis not present

## 2023-04-17 DIAGNOSIS — M62830 Muscle spasm of back: Secondary | ICD-10-CM | POA: Diagnosis not present

## 2023-04-17 DIAGNOSIS — M5416 Radiculopathy, lumbar region: Secondary | ICD-10-CM | POA: Diagnosis not present

## 2023-04-17 DIAGNOSIS — M9903 Segmental and somatic dysfunction of lumbar region: Secondary | ICD-10-CM | POA: Diagnosis not present

## 2023-04-23 ENCOUNTER — Other Ambulatory Visit: Payer: Self-pay | Admitting: Family Medicine

## 2023-04-23 DIAGNOSIS — M62830 Muscle spasm of back: Secondary | ICD-10-CM | POA: Diagnosis not present

## 2023-04-23 DIAGNOSIS — Z1231 Encounter for screening mammogram for malignant neoplasm of breast: Secondary | ICD-10-CM

## 2023-04-23 DIAGNOSIS — M9903 Segmental and somatic dysfunction of lumbar region: Secondary | ICD-10-CM | POA: Diagnosis not present

## 2023-04-23 DIAGNOSIS — M9904 Segmental and somatic dysfunction of sacral region: Secondary | ICD-10-CM | POA: Diagnosis not present

## 2023-04-23 DIAGNOSIS — M5416 Radiculopathy, lumbar region: Secondary | ICD-10-CM | POA: Diagnosis not present

## 2023-05-14 DIAGNOSIS — M9903 Segmental and somatic dysfunction of lumbar region: Secondary | ICD-10-CM | POA: Diagnosis not present

## 2023-05-14 DIAGNOSIS — M5416 Radiculopathy, lumbar region: Secondary | ICD-10-CM | POA: Diagnosis not present

## 2023-05-14 DIAGNOSIS — M62830 Muscle spasm of back: Secondary | ICD-10-CM | POA: Diagnosis not present

## 2023-05-14 DIAGNOSIS — M9904 Segmental and somatic dysfunction of sacral region: Secondary | ICD-10-CM | POA: Diagnosis not present

## 2023-05-23 ENCOUNTER — Ambulatory Visit
Admission: RE | Admit: 2023-05-23 | Discharge: 2023-05-23 | Disposition: A | Discharge status: Home / Self Care | Payer: Medicare Other | Source: Ambulatory Visit | Attending: Family Medicine | Admitting: Family Medicine

## 2023-05-23 DIAGNOSIS — Z1231 Encounter for screening mammogram for malignant neoplasm of breast: Secondary | ICD-10-CM

## 2023-05-24 ENCOUNTER — Ambulatory Visit: Payer: Medicare Other | Admitting: Family Medicine

## 2023-05-27 ENCOUNTER — Encounter: Payer: Self-pay | Admitting: Family Medicine

## 2023-06-11 DIAGNOSIS — M62830 Muscle spasm of back: Secondary | ICD-10-CM | POA: Diagnosis not present

## 2023-06-11 DIAGNOSIS — M9903 Segmental and somatic dysfunction of lumbar region: Secondary | ICD-10-CM | POA: Diagnosis not present

## 2023-06-11 DIAGNOSIS — M5416 Radiculopathy, lumbar region: Secondary | ICD-10-CM | POA: Diagnosis not present

## 2023-06-11 DIAGNOSIS — M9904 Segmental and somatic dysfunction of sacral region: Secondary | ICD-10-CM | POA: Diagnosis not present

## 2023-07-10 DIAGNOSIS — M9903 Segmental and somatic dysfunction of lumbar region: Secondary | ICD-10-CM | POA: Diagnosis not present

## 2023-07-10 DIAGNOSIS — M62830 Muscle spasm of back: Secondary | ICD-10-CM | POA: Diagnosis not present

## 2023-07-10 DIAGNOSIS — M5416 Radiculopathy, lumbar region: Secondary | ICD-10-CM | POA: Diagnosis not present

## 2023-07-10 DIAGNOSIS — M9904 Segmental and somatic dysfunction of sacral region: Secondary | ICD-10-CM | POA: Diagnosis not present

## 2023-07-30 DIAGNOSIS — D225 Melanocytic nevi of trunk: Secondary | ICD-10-CM | POA: Diagnosis not present

## 2023-07-30 DIAGNOSIS — L578 Other skin changes due to chronic exposure to nonionizing radiation: Secondary | ICD-10-CM | POA: Diagnosis not present

## 2023-07-30 DIAGNOSIS — D2261 Melanocytic nevi of right upper limb, including shoulder: Secondary | ICD-10-CM | POA: Diagnosis not present

## 2023-07-30 DIAGNOSIS — D2272 Melanocytic nevi of left lower limb, including hip: Secondary | ICD-10-CM | POA: Diagnosis not present

## 2023-07-30 DIAGNOSIS — D2271 Melanocytic nevi of right lower limb, including hip: Secondary | ICD-10-CM | POA: Diagnosis not present

## 2023-07-30 DIAGNOSIS — D2262 Melanocytic nevi of left upper limb, including shoulder: Secondary | ICD-10-CM | POA: Diagnosis not present

## 2023-07-30 DIAGNOSIS — L821 Other seborrheic keratosis: Secondary | ICD-10-CM | POA: Diagnosis not present

## 2023-08-06 DIAGNOSIS — M62830 Muscle spasm of back: Secondary | ICD-10-CM | POA: Diagnosis not present

## 2023-08-06 DIAGNOSIS — M9904 Segmental and somatic dysfunction of sacral region: Secondary | ICD-10-CM | POA: Diagnosis not present

## 2023-08-06 DIAGNOSIS — M9903 Segmental and somatic dysfunction of lumbar region: Secondary | ICD-10-CM | POA: Diagnosis not present

## 2023-08-06 DIAGNOSIS — M5416 Radiculopathy, lumbar region: Secondary | ICD-10-CM | POA: Diagnosis not present

## 2023-08-08 ENCOUNTER — Encounter: Payer: Self-pay | Admitting: Podiatry

## 2023-08-08 ENCOUNTER — Ambulatory Visit (INDEPENDENT_AMBULATORY_CARE_PROVIDER_SITE_OTHER): Payer: Self-pay | Admitting: Podiatry

## 2023-08-08 DIAGNOSIS — L853 Xerosis cutis: Secondary | ICD-10-CM | POA: Diagnosis not present

## 2023-08-08 DIAGNOSIS — L84 Corns and callosities: Secondary | ICD-10-CM

## 2023-08-08 NOTE — Progress Notes (Signed)
Subjective:  Patient ID: Victoria Morrison, female    DOB: 05-Aug-1946,  MRN: [id number]  Chief Complaint  Patient presents with   foot exam    Patient states she will like a foot exam , she states she always gets a corn on her 5th toe on right foot in winter. Patient states she is not in any pain , she just wants to get her feet checked out     77 y.o. female presents with concern for small corn on fifth toe medial aspect.  Otherwise coming in for a good foot exam.  She does report mild occasional numbness in the forefoot under the ball of the foot bilaterally.  Does not happen all the time but sometimes it is not severe.  She is worn to get checked out to make sure everything is okay.  Denies a history of diabetes.  Past Medical History:  Diagnosis Date   Allergy    SEASONAL   Asthma    hx asthma in the 80's after pneumonia    Asymptomatic bacteriuria    Cancer (HCC)    SKIN   COVID-19 virus infection 06/2020   Diverticulosis    Environmental allergies    dust; pet dander; pollen   History of asthma 1980s   History of MRSA infection 2005   abdomen   History of pneumonia 1980s   History of rheumatoid arthritis 2009   in remission, off MTX since 2005, seronegative   HLD (hyperlipidemia)    HTN (hypertension)    Hx of adenomatous colonic polyps    Hydradenitis 2005   Osteoarthritis 2009   bilateral hands/knees s/p synvisc injections x3 ([facility])   Osteopenia 12/2012   dexa with T score -1.9   Osteopenia    Postmenopausal 2000   Seasonal allergic rhinitis     Allergies  Allergen Reactions   Asa [Aspirin] Hives    Ok to take a few-but not too many   Penicillins Hives    ROS: Negative except as per HPI above  Objective:  General: AAO x3, NAD  Dermatological: Mild xerosis of the plantar skin.  On the right fifth toe there is noted to be small corn present at the medial aspect of the toe.  Pain on palpation area.  No  ulceration underlying  Vascular:  Dorsalis Pedis  artery and Posterior Tibial artery pedal pulses are 2/4 bilateral.  Capillary fill time < 3 sec to all digits.   Neruologic: Grossly intact via light touch bilateral. Protective threshold intact to all sites bilateral.   Musculoskeletal: No gross boney pedal deformities bilateral. No pain, crepitus, or limitation noted with foot and ankle range of motion bilateral. Muscular strength 5/5 in all groups tested bilateral.  Gait: Unassisted, Nonantalgic.   No images are attached to the encounter.  Deferred Assessment:   1. Corn of toe   2. Xerosis of skin      Plan:  Patient was evaluated and treated and all questions answered.  # Corn of toe right foot -Discussed with patient that the corn disease her only real issue with either foot no evidence of PAD or neuropathy -I debrided the corn with a 10 blade at this visit without any issue -Recommend daily moisturizing lotion to prevent recurrence of this area and the use of gel toe cap or spacer to prevent irritation between the fourth and fifth toe on the right foot. -Follow-up as needed if the corn reoccurs and she wants to trim down again  Corinna Gab, DPM [facility] / [facility]

## 2023-09-02 ENCOUNTER — Encounter: Payer: Self-pay | Admitting: Family Medicine

## 2023-09-03 ENCOUNTER — Other Ambulatory Visit: Payer: Self-pay | Admitting: Family Medicine

## 2023-09-03 DIAGNOSIS — E785 Hyperlipidemia, unspecified: Secondary | ICD-10-CM

## 2023-09-03 DIAGNOSIS — M9903 Segmental and somatic dysfunction of lumbar region: Secondary | ICD-10-CM | POA: Diagnosis not present

## 2023-09-03 DIAGNOSIS — M5416 Radiculopathy, lumbar region: Secondary | ICD-10-CM | POA: Diagnosis not present

## 2023-09-03 DIAGNOSIS — E7849 Other hyperlipidemia: Secondary | ICD-10-CM

## 2023-09-03 DIAGNOSIS — M85831 Other specified disorders of bone density and structure, right forearm: Secondary | ICD-10-CM

## 2023-09-03 DIAGNOSIS — I10 Essential (primary) hypertension: Secondary | ICD-10-CM

## 2023-09-03 DIAGNOSIS — M9904 Segmental and somatic dysfunction of sacral region: Secondary | ICD-10-CM | POA: Diagnosis not present

## 2023-09-03 DIAGNOSIS — M62830 Muscle spasm of back: Secondary | ICD-10-CM | POA: Diagnosis not present

## 2023-09-04 ENCOUNTER — Other Ambulatory Visit (INDEPENDENT_AMBULATORY_CARE_PROVIDER_SITE_OTHER): Payer: Medicare Other

## 2023-09-04 DIAGNOSIS — I10 Essential (primary) hypertension: Secondary | ICD-10-CM

## 2023-09-04 DIAGNOSIS — E7849 Other hyperlipidemia: Secondary | ICD-10-CM | POA: Diagnosis not present

## 2023-09-04 DIAGNOSIS — E785 Hyperlipidemia, unspecified: Secondary | ICD-10-CM | POA: Diagnosis not present

## 2023-09-04 DIAGNOSIS — M85831 Other specified disorders of bone density and structure, right forearm: Secondary | ICD-10-CM

## 2023-09-04 LAB — COMPREHENSIVE METABOLIC PANEL
ALT: 14 U/L (ref 0–35)
AST: 18 U/L (ref 0–37)
Albumin: 4.5 g/dL (ref 3.5–5.2)
Alkaline Phosphatase: 71 U/L (ref 39–117)
BUN: 17 mg/dL (ref 6–23)
CO2: 26 meq/L (ref 19–32)
Calcium: 9.5 mg/dL (ref 8.4–10.5)
Chloride: 104 meq/L (ref 96–112)
Creatinine, Ser: 0.84 mg/dL (ref 0.40–1.20)
GFR: 67.38 mL/min (ref >60.00)
Glucose, Bld: 89 mg/dL (ref 70–99)
Potassium: 4.4 meq/L (ref 3.5–5.1)
Sodium: 140 meq/L (ref 135–145)
Total Bilirubin: 0.6 mg/dL (ref 0.2–1.2)
Total Protein: 6.7 g/dL (ref 6.0–8.3)

## 2023-09-04 LAB — TSH: TSH: 2.49 u[IU]/mL (ref 0.35–5.50)

## 2023-09-04 LAB — LIPID PANEL
Cholesterol: 237 mg/dL — ABNORMAL HIGH (ref 0–200)
HDL: 89 mg/dL (ref >39.00)
LDL Cholesterol: 129 mg/dL — ABNORMAL HIGH (ref 0–99)
NonHDL: 148.1
Total CHOL/HDL Ratio: 3
Triglycerides: 96 mg/dL (ref 0.0–149.0)
VLDL: 19.2 mg/dL (ref 0.0–40.0)

## 2023-09-04 LAB — MICROALBUMIN / CREATININE URINE RATIO
Creatinine,U: 111.2 mg/dL
Microalb Creat Ratio: 0.6 mg/g (ref 0.0–30.0)
Microalb, Ur: <0.7 mg/dL (ref 0.0–1.9)

## 2023-09-04 LAB — VITAMIN D 25 HYDROXY (VIT D DEFICIENCY, FRACTURES): VITD: 37.08 ng/mL (ref 30.00–100.00)

## 2023-09-07 LAB — LIPOPROTEIN A (LPA): Lipoprotein (a): 17 nmol/L (ref <75)

## 2023-09-11 ENCOUNTER — Encounter: Payer: Self-pay | Admitting: Family Medicine

## 2023-09-11 NOTE — Telephone Encounter (Signed)
 Spoke with pt and r/s CPE to 09/23/22 at 8:30. Pt expresses her thanks. Fyi to Dr Crissie Dome.

## 2023-09-23 ENCOUNTER — Ambulatory Visit (INDEPENDENT_AMBULATORY_CARE_PROVIDER_SITE_OTHER): Payer: Medicare Other

## 2023-09-23 VITALS — Ht 63.0 in | Wt 170.0 lb

## 2023-09-23 DIAGNOSIS — Z0000 Encounter for general adult medical examination without abnormal findings: Secondary | ICD-10-CM

## 2023-09-23 NOTE — Patient Instructions (Signed)
Victoria Morrison , Thank you for taking time to come for your Medicare Wellness Visit. I appreciate your ongoing commitment to your health goals. Please review the following plan we discussed and let me know if I can assist you in the future.   Referrals/Orders/Follow-Ups/Clinician Recommendations: none  This is a list of the screening recommended for you and due dates:  Health Maintenance  Topic Date Due   Zoster (Shingles) Vaccine (1 of 2) 01/01/1997   DTaP/Tdap/Td vaccine (2 - Tdap) 12/05/2022   Flu Shot  03/28/2023   COVID-19 Vaccine (1 - 2024-25 season) Never done   Mammogram  05/22/2024   Colon Cancer Screening  08/30/2024   Medicare Annual Wellness Visit  09/22/2024   Pneumonia Vaccine  Completed   DEXA scan (bone density measurement)  Completed   Hepatitis C Screening  Completed   HPV Vaccine  Aged Out    Advanced directives: (In Chart) A copy of your advanced directives are scanned into your chart should your provider ever need it.  Next Medicare Annual Wellness Visit scheduled for next year: Yes 09/22/2024 @ 3:40pm televisit

## 2023-09-23 NOTE — Progress Notes (Signed)
Subjective:   Victoria Morrison is a 78 y.o. female who presents for Medicare Annual (Subsequent) preventive examination.  Visit Complete: Virtual I connected with  Guerry Bruin on 09/23/23 by a audio enabled telemedicine application and verified that I am speaking with the correct person using two identifiers.  Patient Location: Home  Provider Location: Home Office  I discussed the limitations of evaluation and management by telemedicine. The patient expressed understanding and agreed to proceed.  Vital Signs: Because this visit was a virtual/telehealth visit, some criteria may be missing or patient reported. Any vitals not documented were not able to be obtained and vitals that have been documented are patient reported.  Patient Medicare AWV questionnaire was completed by the patient on 09/19/23; I have confirmed that all information answered by patient is correct and no changes since this date.  Cardiac Risk Factors include: advanced age (>72men, >59 women);dyslipidemia;hypertension;obesity (BMI >30kg/m2)    Objective:    Today's Vitals   09/23/23 1611  Weight: 170 lb (77.1 kg)  Height: 5\' 3"  (1.6 m)  PainSc: 0-No pain   Body mass index is 30.11 kg/m.     09/23/2023    4:19 PM 09/20/2022    8:20 AM 09/14/2020   10:35 AM 07/28/2019   10:50 AM 05/27/2018    8:51 AM 02/13/2017    1:13 PM 01/03/2016   11:51 AM  Advanced Directives  Does Patient Have a Medical Advance Directive? Yes Yes Yes Yes Yes Yes Yes  Type of Estate agent of [place];Living will Healthcare Power of [place];Living will Living will;Healthcare Power of [employer] Power of [place];Living will Healthcare Power of [place];Living will Healthcare Power of [place];Living will Living will  Does patient want to make changes to medical advance directive?  No - Patient declined     No - Patient declined  Copy of Healthcare Power of Attorney in Chart? Yes - validated most recent copy scanned in  chart (See row information) Yes - validated most recent copy scanned in chart (See row information) Yes - validated most recent copy scanned in chart (See row information) Yes - validated most recent copy scanned in chart (See row information) No - copy requested Yes Yes    Current Medications (verified) Outpatient Encounter Medications as of 09/23/2023  Medication Sig   amLODipine (NORVASC) 5 MG tablet Take 1 tablet (5 mg total) by mouth daily.   NON FORMULARY DoTerra Lifelong Vitality Supplement   pravastatin (PRAVACHOL) 40 MG tablet Take 1 tablet (40 mg total) by mouth daily.   No facility-administered encounter medications on file as of 09/23/2023.    Allergies (verified) Asa [aspirin] and Penicillins   History: Past Medical History:  Diagnosis Date   Allergy    SEASONAL   Asthma    hx asthma in the 80's after pneumonia    Asymptomatic bacteriuria    Cancer (HCC)    SKIN   COVID-19 virus infection 06/2020   Diverticulosis    Environmental allergies    dust; pet dander; pollen   History of asthma 1980s   History of MRSA infection 2005   abdomen   History of pneumonia 1980s   History of rheumatoid arthritis 2009   in remission, off MTX since 2005, seronegative   HLD (hyperlipidemia)    HTN (hypertension)    Hx of adenomatous colonic polyps    Hydradenitis 2005   Osteoarthritis 2009   bilateral hands/knees s/p synvisc injections x3 ([facility])   Osteopenia 12/2012   dexa with  T score -1.9   Osteopenia    Postmenopausal 2000   Seasonal allergic rhinitis    Past Surgical History:  Procedure Laterality Date   APPENDECTOMY  1954   COLONOSCOPY  12/2013   3 TAs, diverticulosis, rpt 5 yrs Russella Dar)   COLONOSCOPY  08/2019   3 HP, 1 TA, mod diverticulosis rpt 5 yrs Russella Dar)   DEXA  12/2012   T score -1.3 femur, -1.9 radius   MEDIAL PARTIAL KNEE REPLACEMENT Bilateral 09/2013   Dr Gavin Potters   MENISCUS REPAIR  2009   Left   TONSILLECTOMY     childhood   Family History   Problem Relation Age of Onset   Cancer Mother 61       breast   Breast cancer Mother    Cancer Maternal Grandmother 50       breast (or unsure)   Breast cancer Maternal Grandmother    Cancer Brother 78       lung (smoker)   Alcoholism Father    CAD Father 5       MI   Stroke Neg Hx    Diabetes Neg Hx    Colon cancer Neg Hx    Rectal cancer Neg Hx    Stomach cancer Neg Hx    Colon polyps Neg Hx    Esophageal cancer Neg Hx    Social History   Socioeconomic History   Marital status: Widowed    Spouse name: Not on file   Number of children: Not on file   Years of education: Not on file   Highest education level: Bachelor's degree (e.g., BA, AB, BS)  Occupational History   Not on file  Tobacco Use   Smoking status: Former    Current packs/day: 0.00    Types: Cigarettes    Quit date: 08/27/1968    Years since quitting: 55.1   Smokeless tobacco: Never  Vaping Use   Vaping status: Never Used  Substance and Sexual Activity   Alcohol use: Yes    Alcohol/week: 6.0 standard drinks of alcohol    Types: 6 Glasses of wine per week    Comment: Regular-wine 2-3 every other day   Drug use: No   Sexual activity: Never  Other Topics Concern   Not on file  Social History Narrative   Widower - husband [name] passed away 06-25-2019 from AML   Goes to [place]   Grown children, 3 sons, 1 daughter, 10 grandchildren   Occupation: retired, was Estate manager/land agent   Edu: college   Activity: walks regularly, gym   Diet: good water, vegetables daily   Social Drivers of Corporate investment banker Strain: Low Risk  (09/23/2023)   Overall Financial Resource Strain (CARDIA)    Difficulty of Paying Living Expenses: Not hard at all  Food Insecurity: No Food Insecurity (09/23/2023)   Hunger Vital Sign    Worried About Running Out of Food in the Last Year: Never true    Ran Out of Food in the Last Year: Never true  Transportation Needs: No Transportation Needs (09/23/2023)   PRAPARE -  Administrator, Civil Service (Medical): No    Lack of Transportation (Non-Medical): No  Physical Activity: Sufficiently Active (09/23/2023)   Exercise Vital Sign    Days of Exercise per Week: 7 days    Minutes of Exercise per Session: 60 min  Stress: No Stress Concern Present (09/23/2023)   [employer] of Occupational Health - Occupational Stress Questionnaire  Feeling of Stress : Not at all  Social Connections: Moderately Integrated (09/23/2023)   Social Connection and Isolation Panel [NHANES]    Frequency of Communication with Friends and Family: Three times a week    Frequency of Social Gatherings with Friends and Family: Three times a week    Attends Religious Services: More than 4 times per year    Active Member of Clubs or Organizations: No    Attends Banker Meetings: More than 4 times per year    Marital Status: Widowed  Recent Concern: Social Connections - Moderately Isolated (09/20/2023)   Social Connection and Isolation Panel [NHANES]    Frequency of Communication with Friends and Family: More than three times a week    Frequency of Social Gatherings with Friends and Family: Three times a week    Attends Religious Services: More than 4 times per year    Active Member of Clubs or Organizations: No    Attends Banker Meetings: Not on file    Marital Status: Widowed    Tobacco Counseling Counseling given: Not Answered   Clinical Intake:  Pre-visit preparation completed: Yes  Pain : No/denies pain Pain Score: 0-No pain   BMI - recorded: 30.11 Nutritional Status: BMI > 30  Obese Nutritional Risks: None Diabetes: No  How often do you need to have someone help you when you read instructions, pamphlets, or other written materials from your doctor or pharmacy?: 1 - Never  Interpreter Needed?: No  Comments: lives with daughter and her family Information entered by :: B.Verlon Pischke,LPN   Activities of Daily Living     09/19/2023    3:37 PM  In your present state of health, do you have any difficulty performing the following activities:  Hearing? 0  Vision? 0  Difficulty concentrating or making decisions? 0  Walking or climbing stairs? 0  Dressing or bathing? 0  Doing errands, shopping? 0  Preparing Food and eating ? N  Using the Toilet? N  In the past six months, have you accidently leaked urine? N  Do you have problems with loss of bowel control? N  Managing your Medications? N  Managing your Finances? N  Housekeeping or managing your Housekeeping? N    Patient Care Team: Eustaquio Boyden, MD as PCP - General (Family Medicine) Erin Sons, MD (Inactive) as Consulting Physician (Orthopedic Surgery) Troxler, Blanchie Serve (Inactive) as Attending Physician (Podiatry) Pa, [facility]  Indicate any recent Medical Services you may have received from other than [facility] providers in the past year (date may be approximate).     Assessment:   This is a routine wellness examination for [facility].  Hearing/Vision screen Hearing Screening - Comments:: Pt hears well with hearing aids Vision Screening - Comments:: Pt says her vision is good w/glasses [facility]   Goals Addressed             This Visit's Progress    COMPLETED: DIET - EAT MORE FRUITS AND VEGETABLES       COMPLETED: Increase physical activity   On track    Starting 05/27/2018, I will continue to walk 1-2 miles daily.      COMPLETED: Patient Stated   On track    07/28/2019, I will try to work on losing some weight.       Depression Screen    09/23/2023    4:15 PM 09/21/2022    9:54 AM 09/20/2022    8:18 AM 09/19/2021   10:05 AM 09/14/2020  10:36 AM 07/28/2019   10:52 AM 05/27/2018    8:52 AM  PHQ 2/9 Scores  PHQ - 2 Score 0 0 0 0 0 1 0  PHQ- 9 Score   0  0 1 0    Fall Risk    09/19/2023    3:37 PM 09/21/2022    9:54 AM 09/20/2022    8:20 AM 09/19/2021   10:05 AM 09/14/2020   10:36 AM  Fall Risk   Falls in the  past year? 0 0 0 0 0  Number falls in past yr: 0  0  0  Injury with Fall? 0  0  0  Risk for fall due to : No Fall Risks  No Fall Risks  No Fall Risks  Follow up Education provided;Falls prevention discussed  Falls prevention discussed;Falls evaluation completed  Falls evaluation completed;Falls prevention discussed    MEDICARE RISK AT HOME: Medicare Risk at Home Any stairs in or around the home?: (Patient-Rptd) Yes If so, are there any without handrails?: (Patient-Rptd) No Home free of loose throw rugs in walkways, pet beds, electrical cords, etc?: (Patient-Rptd) Yes Adequate lighting in your home to reduce risk of falls?: (Patient-Rptd) Yes Life alert?: (Patient-Rptd) No Use of a cane, walker or w/c?: (Patient-Rptd) No Grab bars in the bathroom?: (Patient-Rptd) No Shower chair or bench in shower?: (Patient-Rptd) No Elevated toilet seat or a handicapped toilet?: (Patient-Rptd) Yes  TIMED UP AND GO:  Was the test performed?  No    Cognitive Function:    09/14/2020   10:38 AM 07/28/2019   10:55 AM 05/27/2018    8:52 AM 02/13/2017    1:24 PM 01/03/2016   11:18 AM  MMSE - Mini Mental State Exam  Not completed: Refused      Orientation to time  5 5 5 5   Orientation to Place  5 5 5 5   Registration  3 3 3 3   Attention/ Calculation  5 0 0 0  Recall  3 3 3 3   Language- name 2 objects   0 0 0  Language- repeat  1 1 1 1   Language- follow 3 step command   3 3 3   Language- read & follow direction   0 0 0  Write a sentence   0 0 0  Copy design   0 0 0  Total score   20 20 20         09/23/2023    4:25 PM 09/20/2022    8:25 AM  6CIT Screen  What Year? 0 points 0 points  What month? 0 points 0 points  What time? 0 points 0 points  Count back from 20 0 points 0 points  Months in reverse 0 points 0 points  Repeat phrase 0 points 0 points  Total Score 0 points 0 points    Immunizations Immunization History  Administered Date(s) Administered   Influenza,inj,Quad PF,6+ Mos  05/15/2013, 06/07/2014, 05/11/2015   Pneumococcal Conjugate-13 12/09/2013   Pneumococcal Polysaccharide-23 06/08/2004, 05/07/2012   Td 12/04/2012   Zoster, Live 12/04/2012    TDAP status: Up to date  Flu Vaccine status: Declined, Education has been provided regarding the importance of this vaccine but patient still declined. Advised may receive this vaccine at local pharmacy or Health Dept. Aware to provide a copy of the vaccination record if obtained from local pharmacy or Health Dept. Verbalized acceptance and understanding.  Pneumococcal vaccine status: Up to date  Covid-19 vaccine status: Declined, Education has been provided regarding the importance  of this vaccine but patient still declined. Advised may receive this vaccine at local pharmacy or Health Dept.or vaccine clinic. Aware to provide a copy of the vaccination record if obtained from local pharmacy or Health Dept. Verbalized acceptance and understanding.  Qualifies for Shingles Vaccine? Yes   Zostavax completed No   Shingrix Completed?: No.    Education has been provided regarding the importance of this vaccine. Patient has been advised to call insurance company to determine out of pocket expense if they have not yet received this vaccine. Advised may also receive vaccine at local pharmacy or Health Dept. Verbalized acceptance and understanding.  Screening Tests Health Maintenance  Topic Date Due   Zoster Vaccines- Shingrix (1 of 2) 01/01/1997   DTaP/Tdap/Td (2 - Tdap) 12/05/2022   INFLUENZA VACCINE  03/28/2023   COVID-19 Vaccine (1 - 2024-25 season) Never done   MAMMOGRAM  05/22/2024   Colonoscopy  08/30/2024   Medicare Annual Wellness (AWV)  09/22/2024   Pneumonia Vaccine 42+ Years old  Completed   DEXA SCAN  Completed   Hepatitis C Screening  Completed   HPV VACCINES  Aged Out    Health Maintenance  Health Maintenance Due  Topic Date Due   Zoster Vaccines- Shingrix (1 of 2) 01/01/1997   DTaP/Tdap/Td (2 - Tdap)  12/05/2022   INFLUENZA VACCINE  03/28/2023   COVID-19 Vaccine (1 - 2024-25 season) Never done    Colorectal cancer screening: No longer required.   Mammogram status: No longer required due to age.  Bone Density status: Completed 05/11/2020. Results reflect: Bone density results: OSTEOPENIA. Repeat every 5 years.  Lung Cancer Screening: (Low Dose CT Chest recommended if Age 85-80 years, 20 pack-year currently smoking OR have quit w/in 15years.) does not qualify.   Lung Cancer Screening Referral: no  Additional Screening:  Hepatitis C Screening: does not qualify; Completed 05/11/2015  Vision Screening: Recommended annual ophthalmology exams for early detection of glaucoma and other disorders of the eye. Is the patient up to date with their annual eye exam?  Yes  Who is the provider or what is the name of the office in which the patient attends annual eye exams? [facility] If pt is not established with a provider, would they like to be referred to a provider to establish care? No .   Dental Screening: Recommended annual dental exams for proper oral hygiene  Diabetic Foot Exam: n/a  Community Resource Referral / Chronic Care Management: CRR required this visit?  No   CCM required this visit?  No     Plan:     I have personally reviewed and noted the following in the patient's chart:   Medical and social history Use of alcohol, tobacco or illicit drugs  Current medications and supplements including opioid prescriptions. Patient is not currently taking opioid prescriptions. Functional ability and status Nutritional status Physical activity Advanced directives List of other physicians Hospitalizations, surgeries, and ER visits in previous 12 months Vitals Screenings to include cognitive, depression, and falls Referrals and appointments  In addition, I have reviewed and discussed with patient certain preventive protocols, quality metrics, and best practice  recommendations. A written personalized care plan for preventive services as well as general preventive health recommendations were provided to patient.    Sue Lush, LPN   1/61/0960   After Visit Summary: (MyChart) Due to this being a telephonic visit, the after visit summary with patients personalized plan was offered to patient via MyChart   Nurse Notes: The patient  states she is doing well. She does relay she is having some intermittent lft heel pain and arthritic rt hand thumb pain for 1-2 months. She will mention to [facility] at visit tomorrow. She has no concerns or questions at this time.

## 2023-09-24 ENCOUNTER — Encounter: Payer: Self-pay | Admitting: Family Medicine

## 2023-09-24 ENCOUNTER — Ambulatory Visit (INDEPENDENT_AMBULATORY_CARE_PROVIDER_SITE_OTHER): Payer: Medicare Other | Admitting: Family Medicine

## 2023-09-24 VITALS — BP 124/84 | HR 61 | Temp 97.7°F | Ht 62.5 in | Wt 173.0 lb

## 2023-09-24 DIAGNOSIS — I499 Cardiac arrhythmia, unspecified: Secondary | ICD-10-CM | POA: Diagnosis not present

## 2023-09-24 DIAGNOSIS — M85831 Other specified disorders of bone density and structure, right forearm: Secondary | ICD-10-CM | POA: Diagnosis not present

## 2023-09-24 DIAGNOSIS — I10 Essential (primary) hypertension: Secondary | ICD-10-CM

## 2023-09-24 DIAGNOSIS — Z96653 Presence of artificial knee joint, bilateral: Secondary | ICD-10-CM | POA: Diagnosis not present

## 2023-09-24 DIAGNOSIS — R001 Bradycardia, unspecified: Secondary | ICD-10-CM | POA: Insufficient documentation

## 2023-09-24 DIAGNOSIS — M150 Primary generalized (osteo)arthritis: Secondary | ICD-10-CM | POA: Diagnosis not present

## 2023-09-24 DIAGNOSIS — Z7189 Other specified counseling: Secondary | ICD-10-CM

## 2023-09-24 DIAGNOSIS — E785 Hyperlipidemia, unspecified: Secondary | ICD-10-CM

## 2023-09-24 MED ORDER — AMLODIPINE BESYLATE 5 MG PO TABS: 5.0000 mg | ORAL_TABLET | Freq: Every day | ORAL | 4 refills | Status: DC

## 2023-09-24 MED ORDER — PRAVASTATIN SODIUM 40 MG PO TABS: 40.0000 mg | ORAL_TABLET | Freq: Every day | ORAL | 4 refills | Status: DC

## 2023-09-24 NOTE — Patient Instructions (Signed)
EKG today Good to see you today Try frozen water bottle massage, foot stretches daily for left heel pain - possible plantar fascitis vs heel spur.  Return as needed or in 1 year for next wellness visit

## 2023-09-24 NOTE — Assessment & Plan Note (Addendum)
Chronic, improving on pravastatin daily - continue. Discussed ideally would want LDL <100. Reviewed diet choices to improve LDL levels.  The 10-year ASCVD risk score (Arnett DK, et al., 2019) is: 22.7%   Values used to calculate the score:     Age: 78 years     Sex: Female     Is Non-Hispanic African American: No     Diabetic: No     Tobacco smoker: No     Systolic Blood Pressure: 124 mmHg     Is BP treated: Yes     HDL Cholesterol: 89 mg/dL     Total Cholesterol: 237 mg/dL

## 2023-09-24 NOTE — Progress Notes (Signed)
Ph: [phone] Fax: [phone]   Patient ID: Victoria Morrison, female    DOB: [birth year], 78 y.o.   MRN: [id number]  This visit was conducted in person.  BP 124/84   Pulse 61   Temp 97.7 F (36.5 C) (Oral)   Ht 5' 2.5" (1.588 m)   Wt 173 lb (78.5 kg)   SpO2 98%   BMI 31.14 kg/m    CC: CPE Subjective:   HPI: Victoria Morrison is a 78 y.o. female presenting on 09/24/2023 for Annual Exam (MCR prt 2 [AWV- 09/23/23].)   Saw health advisor yesterday for medicare wellness visit. Note reviewed.   No results found.  Flowsheet Row Clinical Support from 09/23/2023 in [facility] [place]  PHQ-2 Total Score 0          09/19/2023    3:37 PM 09/21/2022    9:54 AM 09/20/2022    8:20 AM 09/19/2021   10:05 AM 09/14/2020   10:36 AM  Fall Risk   Falls in the past year? 0 0 0 0 0  Number falls in past yr: 0  0  0  Injury with Fall? 0  0  0  Risk for fall due to : No Fall Risks  No Fall Risks  No Fall Risks  Follow up Education provided;Falls prevention discussed  Falls prevention discussed;Falls evaluation completed  Falls evaluation completed;Falls prevention discussed    Intermittent L heel pain at sole most noted when she stands up.  Chronic R thumb pain - manages with advil and/or deep blue topically. Worse in the cold.  Walking 1 mile/day - no pain with walking.   By the way - notes occasional exertional dyspnea "trouble catching breath" ie walking up an incline.   Continues living with daughter locally in [place].   Regular with Doterra supplements - taking about 2000+ IU vit D.    Hyperlipidemia - chronic, she continues pravastatin nightly. Father with CAD/MI age 83.    Preventative: COLONOSCOPY 08/2019 - 3 HP, 1 TA, mod diverticulosis rpt 5 yrs Russella Dar) Well woman - last 01/2017, normal with absent endocervical zone due to atrophy. Always normal paps in past. Will stop cervical cancer screening.  [facility]  Gets yearly due to fmhx. DEXA Date: 12/2012 T score -1.3 femur, -1.9 radius.  DEXA 04/2020: R forearm -1.8, RFN -1.3. Takes bone supplement from [place]. Continue walking regulary.  Lung cancer screening - not eligible.  Flu shot - declined  COVID vaccine - declined Tetanus - 11/2012  Pneumovax - 05/07/2012, prevnar-13 11/2013  Zostavax - 11/2012  RSV - declines Shingrix - discussed, declines Advanced directives - scanned 12/2015. HCPOA is daughter Burnett Harry) then son Perlie Gold). Living will in chart.  Seat belt use discussed  Sunscreen use discussed, no changing moles on skin. Sees derm yearly.  Sleep - averaging 7 hours/night Non smoker  Alcohol - occasional  Dentist Q6 mo - monitoring growths in mouth  Eye exam yearly  Bowel - no diarrhea/constipation  Bladder - no incontinence  G4P4    Widower - husband [name] passed away June 15, 2019 from AML Parishioner at [employer]  Grown children, 3 sons, 1 daughter, 10 grandchildren Occupation: retired, was Estate manager/land agent Edu: college Activity: walks regularly, active at [employer]  Diet: good water, vegetables daily      Relevant past medical, surgical, family and social history reviewed and updated as indicated. Interim medical history since our last visit reviewed. Allergies and medications  reviewed and updated. Outpatient Medications Prior to Visit  Medication Sig Dispense Refill   NON FORMULARY DoTerra Lifelong Vitality Supplement     amLODipine (NORVASC) 5 MG tablet Take 1 tablet (5 mg total) by mouth daily. 90 tablet 4   pravastatin (PRAVACHOL) 40 MG tablet Take 1 tablet (40 mg total) by mouth daily. 90 tablet 4   No facility-administered medications prior to visit.     Per HPI unless specifically indicated in ROS section below Review of Systems  Respiratory:  Positive for shortness of breath.   Gastrointestinal:  Negative for constipation and diarrhea.    Objective:  BP 124/84   Pulse 61   Temp 97.7 F (36.5 C) (Oral)   Ht 5'  2.5" (1.588 m)   Wt 173 lb (78.5 kg)   SpO2 98%   BMI 31.14 kg/m   Wt Readings from Last 3 Encounters:  09/24/23 173 lb (78.5 kg)  09/23/23 170 lb (77.1 kg)  09/21/22 166 lb 4 oz (75.4 kg)      Physical Exam Vitals and nursing note reviewed.  Constitutional:      Appearance: Normal appearance. She is not ill-appearing.  HENT:     Head: Normocephalic and atraumatic.     Right Ear: Tympanic membrane, ear canal and external ear normal. There is no impacted cerumen.     Left Ear: Tympanic membrane, ear canal and external ear normal. There is no impacted cerumen.     Mouth/Throat:     Mouth: Mucous membranes are moist.     Pharynx: Oropharynx is clear. No oropharyngeal exudate or posterior oropharyngeal erythema.  Eyes:     General:        Right eye: No discharge.        Left eye: No discharge.     Extraocular Movements: Extraocular movements intact.     Conjunctiva/sclera: Conjunctivae normal.     Pupils: Pupils are equal, round, and reactive to light.  Neck:     Thyroid: No thyroid mass or thyromegaly.     Vascular: No carotid bruit.  Cardiovascular:     Rate and Rhythm: Normal rate. Rhythm irregular.     Pulses: Normal pulses.     Heart sounds: Normal heart sounds. No murmur heard.    Comments: Occ coupling, occ ectopy Pulmonary:     Effort: Pulmonary effort is normal. No respiratory distress.     Breath sounds: Normal breath sounds. No wheezing, rhonchi or rales.  Abdominal:     General: Bowel sounds are normal. There is no distension.     Palpations: Abdomen is soft. There is no mass.     Tenderness: There is no abdominal tenderness. There is no guarding or rebound.     Hernia: No hernia is present.  Musculoskeletal:     Cervical back: Normal range of motion and neck supple. No rigidity.     Right lower leg: No edema.     Left lower leg: No edema.  Lymphadenopathy:     Cervical: No cervical adenopathy.  Skin:    General: Skin is warm and dry.     Findings: No  rash.  Neurological:     General: No focal deficit present.     Mental Status: She is alert. Mental status is at baseline.  Psychiatric:        Mood and Affect: Mood normal.        Behavior: Behavior normal.       Results for orders placed or performed in visit  on 09/04/23  Lipoprotein A (LPA)   Collection Time: 09/04/23  8:22 AM  Result Value Ref Range   Lipoprotein (a) 17 <75 nmol/L  VITAMIN D 25 Hydroxy (Vit-D Deficiency, Fractures)   Collection Time: 09/04/23  8:22 AM  Result Value Ref Range   VITD 37.08 30.00 - 100.00 ng/mL  TSH   Collection Time: 09/04/23  8:22 AM  Result Value Ref Range   TSH 2.49 0.35 - 5.50 uIU/mL  Microalbumin / creatinine urine ratio   Collection Time: 09/04/23  8:22 AM  Result Value Ref Range   Microalb, Ur <0.7 0.0 - 1.9 mg/dL   Creatinine,U 161.0 mg/dL   Microalb Creat Ratio 0.6 0.0 - 30.0 mg/g  Comprehensive metabolic panel   Collection Time: 09/04/23  8:22 AM  Result Value Ref Range   Sodium 140 135 - 145 mEq/L   Potassium 4.4 3.5 - 5.1 mEq/L   Chloride 104 96 - 112 mEq/L   CO2 26 19 - 32 mEq/L   Glucose, Bld 89 70 - 99 mg/dL   BUN 17 6 - 23 mg/dL   Creatinine, Ser 9.60 0.40 - 1.20 mg/dL   Total Bilirubin 0.6 0.2 - 1.2 mg/dL   Alkaline Phosphatase 71 39 - 117 U/L   AST 18 0 - 37 U/L   ALT 14 0 - 35 U/L   Total Protein 6.7 6.0 - 8.3 g/dL   Albumin 4.5 3.5 - 5.2 g/dL   GFR 45.40 >98.11 mL/min   Calcium 9.5 8.4 - 10.5 mg/dL  Lipid panel   Collection Time: 09/04/23  8:22 AM  Result Value Ref Range   Cholesterol 237 (H) 0 - 200 mg/dL   Triglycerides 91.4 0.0 - 149.0 mg/dL   HDL 78.29 >56.21 mg/dL   VLDL 30.8 0.0 - 65.7 mg/dL   LDL Cholesterol 846 (H) 0 - 99 mg/dL   Total CHOL/HDL Ratio 3    NonHDL 148.10    EKG - sinus bradycardia 50s with rate variability, normal axis, intervals, no hypertrophy or acute ST/T changes  Assessment & Plan:   Problem List Items Addressed This Visit     Advanced directives, counseling/discussion -  Primary (Chronic)   Advanced directives previously discussed - scanned 12/2015. HCPOA is daughter Burnett Harry) then son Perlie Gold). Living will in chart.       HTN (hypertension)   Chronic, stable. Continue current regimen.       Relevant Medications   amLODipine (NORVASC) 5 MG tablet   pravastatin (PRAVACHOL) 40 MG tablet   Other Relevant Orders   EKG 12-Lead (Completed)   HLD (hyperlipidemia)   Chronic, improving on pravastatin daily - continue. Discussed ideally would want LDL <100. Reviewed diet choices to improve LDL levels.  The 10-year ASCVD risk score (Arnett DK, et al., 2019) is: 22.7%   Values used to calculate the score:     Age: 21 years     Sex: Female     Is Non-Hispanic African American: No     Diabetic: No     Tobacco smoker: No     Systolic Blood Pressure: 124 mmHg     Is BP treated: Yes     HDL Cholesterol: 89 mg/dL     Total Cholesterol: 237 mg/dL       Relevant Medications   amLODipine (NORVASC) 5 MG tablet   pravastatin (PRAVACHOL) 40 MG tablet   Osteoarthritis   Discussed R 1st [facility] osteoarthritis supportive measures.       Osteopenia   Continue doterra supplements.  Update DEXA next year.       Status post bilateral unicompartmental knee replacement   Irregular heart beats   Also notes intermittent exertional dyspnea most noticeable when going up an incline.  Update EKG today - bradycardia with rate variability 40-60.  She will monitor pulse and let me know if develops difficulty with symptomatic bradycardia.       Relevant Orders   EKG 12-Lead (Completed)     Meds ordered this encounter  Medications   amLODipine (NORVASC) 5 MG tablet    Sig: Take 1 tablet (5 mg total) by mouth daily.    Dispense:  90 tablet    Refill:  4   pravastatin (PRAVACHOL) 40 MG tablet    Sig: Take 1 tablet (40 mg total) by mouth daily.    Dispense:  90 tablet    Refill:  4    Orders Placed This Encounter  Procedures   EKG 12-Lead    Patient Instructions  EKG  today Good to see you today Try frozen water bottle massage, foot stretches daily for left heel pain - possible plantar fascitis vs heel spur.  Return as needed or in 1 year for next wellness visit   Follow up plan: Return in about 1 year (around 09/23/2024) for medicare wellness visit.  Eustaquio Boyden, MD

## 2023-09-24 NOTE — Assessment & Plan Note (Signed)
Continue doterra supplements. Update DEXA next year.

## 2023-09-24 NOTE — Assessment & Plan Note (Signed)
Chronic, stable. Continue current regimen.

## 2023-09-24 NOTE — Assessment & Plan Note (Addendum)
Also notes intermittent exertional dyspnea most noticeable when going up an incline.  Update EKG today - bradycardia with rate variability 40-60.  She will monitor pulse and let me know if develops difficulty with symptomatic bradycardia.

## 2023-09-24 NOTE — Assessment & Plan Note (Signed)
Discussed R 1st [facility] osteoarthritis supportive measures.

## 2023-09-24 NOTE — Assessment & Plan Note (Addendum)
Advanced directives previously discussed - scanned 12/2015. HCPOA is daughter Burnett Harry) then son Perlie Gold). Living will in chart.

## 2023-10-01 ENCOUNTER — Ambulatory Visit: Payer: Medicare Other

## 2023-10-01 ENCOUNTER — Ambulatory Visit: Payer: Medicare Other | Admitting: Family Medicine

## 2023-10-02 DIAGNOSIS — M9904 Segmental and somatic dysfunction of sacral region: Secondary | ICD-10-CM | POA: Diagnosis not present

## 2023-10-02 DIAGNOSIS — M9903 Segmental and somatic dysfunction of lumbar region: Secondary | ICD-10-CM | POA: Diagnosis not present

## 2023-10-02 DIAGNOSIS — M62830 Muscle spasm of back: Secondary | ICD-10-CM | POA: Diagnosis not present

## 2023-10-02 DIAGNOSIS — M5416 Radiculopathy, lumbar region: Secondary | ICD-10-CM | POA: Diagnosis not present

## 2023-10-14 DIAGNOSIS — H35371 Puckering of macula, right eye: Secondary | ICD-10-CM | POA: Diagnosis not present

## 2023-10-30 DIAGNOSIS — M9904 Segmental and somatic dysfunction of sacral region: Secondary | ICD-10-CM | POA: Diagnosis not present

## 2023-10-30 DIAGNOSIS — M9903 Segmental and somatic dysfunction of lumbar region: Secondary | ICD-10-CM | POA: Diagnosis not present

## 2023-10-30 DIAGNOSIS — M62830 Muscle spasm of back: Secondary | ICD-10-CM | POA: Diagnosis not present

## 2023-10-30 DIAGNOSIS — M5416 Radiculopathy, lumbar region: Secondary | ICD-10-CM | POA: Diagnosis not present

## 2023-11-25 DIAGNOSIS — M9904 Segmental and somatic dysfunction of sacral region: Secondary | ICD-10-CM | POA: Diagnosis not present

## 2023-11-25 DIAGNOSIS — M62830 Muscle spasm of back: Secondary | ICD-10-CM | POA: Diagnosis not present

## 2023-11-25 DIAGNOSIS — M9903 Segmental and somatic dysfunction of lumbar region: Secondary | ICD-10-CM | POA: Diagnosis not present

## 2023-11-25 DIAGNOSIS — M5416 Radiculopathy, lumbar region: Secondary | ICD-10-CM | POA: Diagnosis not present

## 2023-12-12 ENCOUNTER — Telehealth: Payer: Self-pay

## 2023-12-12 NOTE — Telephone Encounter (Signed)
 Noted. Will see then.

## 2023-12-12 NOTE — Telephone Encounter (Signed)
 I spoke with pt;pt said in Jan Dr G noticed irregular heart beat. Pt is planning a trip to [place] and wants to  be checked before the trip for peace of mind; pt not having problem right now. Pt  scheduled appt on 12/16/23 at 2 PM with Dr Crissie Dome with UC & ED precautions and pt voiced understanding. Sending note to Dr Crissie Dome and G pool.

## 2023-12-16 ENCOUNTER — Ambulatory Visit: Attending: Family Medicine

## 2023-12-16 ENCOUNTER — Ambulatory Visit (INDEPENDENT_AMBULATORY_CARE_PROVIDER_SITE_OTHER): Admitting: Family Medicine

## 2023-12-16 ENCOUNTER — Encounter: Payer: Self-pay | Admitting: Family Medicine

## 2023-12-16 VITALS — BP 134/70 | HR 68 | Temp 98.8°F | Ht 62.5 in | Wt 173.0 lb

## 2023-12-16 DIAGNOSIS — R0609 Other forms of dyspnea: Secondary | ICD-10-CM | POA: Diagnosis not present

## 2023-12-16 DIAGNOSIS — R001 Bradycardia, unspecified: Secondary | ICD-10-CM

## 2023-12-16 DIAGNOSIS — E7800 Pure hypercholesterolemia, unspecified: Secondary | ICD-10-CM

## 2023-12-16 NOTE — Assessment & Plan Note (Signed)
 Chronic, stable on pravastatin . 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

## 2023-12-16 NOTE — Assessment & Plan Note (Addendum)
 Brings recorded pulses down to low 40s - this occurs frequently. She overall feels well with this - denies palpitations or shortness of breath however does endorse intermittent exertional dyspnea and chest tightness when more active.  Recent labs reassuring, continue low potency pravastatin  for the time being.  Will order heart monitor 2 wk Zio patch to further quantify amount of time she remains bradycardic and severity of bradycardia.  Will also refer to cardiology for further evaluation of exertional dyspnea/chest tightness.  Fmhx CAD (father deceased from MI at age 23)

## 2023-12-16 NOTE — Patient Instructions (Signed)
 I will order zio patch - expect this in the mail.  I will refer you to heart doctor for an evaluation prior to your trip.  Continue current medicines at this time

## 2023-12-16 NOTE — Assessment & Plan Note (Signed)
 Ongoing.  ?anginal equivalent  Refer to cardiology for further evaluation.

## 2023-12-16 NOTE — Progress Notes (Signed)
 Ph: [phone] Fax: [phone]   Patient ID: Victoria Morrison, female    DOB: 06-Aug-1946, 78 y.o.   MRN: [id number]  This visit was conducted in person.  BP 134/70   Pulse 68   Temp 98.8 F (37.1 C) (Oral)   Ht 5' 2.5" (1.588 m)   Wt 173 lb (78.5 kg)   SpO2 95%   BMI 31.14 kg/m    CC: irregular heart beat  Subjective:   HPI: Victoria Morrison is a 78 y.o. female presenting on 12/16/2023 for Medical Management of Chronic Issues (Here to discuss irregular HB. )   See prior note for details.  Seen here for CPE 08/2023 at that time noted occasional exertional dyspnea ie walking up an incline, as well as occ chest pressure sensation when more active ie during aerobic class (3x/wk). Also noted irregular heart rhythm.  EKG at that time: sinus bradycardia 50s with rate variability, normal axis, intervals, no hypertrophy or acute ST/T changes.   She is only on amlodipine  5mg  daily along with pravastatin  and DoTerra lifelong vitality supplement (chronic use).   She brings log of heart rate - running down to the 40s over the past 4 months - this occurs while in resting state. Highest heart rate post-exercise 120s.   [facility] 1 mile/day.   She has upcoming pilgrimage trip to [place] in 2024-06-11.   Fmhx CAD - father deceased from MI age 70yo.      Relevant past medical, surgical, family and social history reviewed and updated as indicated. Interim medical history since our last visit reviewed. Allergies and medications reviewed and updated. Outpatient Medications Prior to Visit  Medication Sig Dispense Refill   amLODipine  (NORVASC ) 5 MG tablet Take 1 tablet (5 mg total) by mouth daily. 90 tablet 4   NON FORMULARY DoTerra Lifelong Vitality Supplement     pravastatin  (PRAVACHOL ) 40 MG tablet Take 1 tablet (40 mg total) by mouth daily. 90 tablet 4   No facility-administered medications prior to visit.    Past Medical History:  Diagnosis Date   Allergy     SEASONAL   Asthma    hx asthma in the 80's after pneumonia    Asymptomatic bacteriuria    Cancer (HCC)    SKIN   COVID-19 virus infection 06/2020   Diverticulosis    Environmental allergies    dust; pet dander; pollen   History of asthma 1980s   History of MRSA infection 2005   abdomen   History of pneumonia 1980s   History of rheumatoid arthritis 2009   in remission, off MTX since 2005, seronegative   HLD (hyperlipidemia)    HTN (hypertension)    Hx of adenomatous colonic polyps    Hydradenitis 2005   Osteoarthritis 2009   bilateral hands/knees s/p synvisc injections x3 ([facility])   Osteopenia 12/2012   dexa with T score -1.9   Osteopenia    Postmenopausal 2000   Seasonal allergic rhinitis     Past Surgical History:  Procedure Laterality Date   APPENDECTOMY  1954   COLONOSCOPY  12/2013   3 TAs, diverticulosis, rpt 5 yrs Sandrea Cruel)   COLONOSCOPY  08/2019   3 HP, 1 TA, mod diverticulosis rpt 5 yrs Sandrea Cruel)   DEXA  12/2012   T score -1.3 femur, -1.9 radius   MEDIAL PARTIAL KNEE REPLACEMENT Bilateral 09/2013   Dr Ivette Marks   MENISCUS REPAIR  2009   Left  TONSILLECTOMY     childhood    Family History  Problem Relation Age of Onset   Cancer Mother 22       breast   Breast cancer Mother    Alcoholism Father    CAD Father 90       MI   Cancer Brother 74       lung (smoker)   Cancer Maternal Grandmother 71       breast (or unsure)   Breast cancer Maternal Grandmother    Stroke Neg Hx    Diabetes Neg Hx    Colon cancer Neg Hx    Rectal cancer Neg Hx    Stomach cancer Neg Hx    Colon polyps Neg Hx    Esophageal cancer Neg Hx    Per HPI unless specifically indicated in ROS section below Review of Systems  Objective:  BP 134/70   Pulse 68   Temp 98.8 F (37.1 C) (Oral)   Ht 5' 2.5" (1.588 m)   Wt 173 lb (78.5 kg)   SpO2 95%   BMI 31.14 kg/m   Wt Readings from Last 3 Encounters:  12/16/23 173 lb (78.5 kg)  09/24/23 173 lb (78.5 kg)  09/23/23 170  lb (77.1 kg)      Physical Exam Vitals and nursing note reviewed.  Constitutional:      Appearance: Normal appearance. She is not ill-appearing.  HENT:     Mouth/Throat:     Mouth: Mucous membranes are moist.     Pharynx: Oropharynx is clear. No oropharyngeal exudate or posterior oropharyngeal erythema.  Eyes:     Extraocular Movements: Extraocular movements intact.     Conjunctiva/sclera: Conjunctivae normal.     Pupils: Pupils are equal, round, and reactive to light.  Neck:     Thyroid : No thyroid  mass or thyromegaly.  Cardiovascular:     Rate and Rhythm: Normal rate. Rhythm irregular. Occasional Extrasystoles are present.    Pulses: Normal pulses.     Heart sounds: Normal heart sounds. No murmur heard.    Comments: Mild arrhythmia present Pulmonary:     Effort: Pulmonary effort is normal. No respiratory distress.     Breath sounds: Normal breath sounds. No wheezing, rhonchi or rales.  Musculoskeletal:     Right lower leg: No edema.     Left lower leg: No edema.  Skin:    General: Skin is warm and dry.  Neurological:     Mental Status: She is alert.  Psychiatric:        Mood and Affect: Mood normal.        Behavior: Behavior normal.       Results for orders placed or performed in visit on 09/04/23  Lipoprotein A (LPA)   Collection Time: 09/04/23  8:22 AM  Result Value Ref Range   Lipoprotein (a) 17 <75 nmol/L  VITAMIN D  25 Hydroxy (Vit-D Deficiency, Fractures)   Collection Time: 09/04/23  8:22 AM  Result Value Ref Range   VITD 37.08 30.00 - 100.00 ng/mL  TSH   Collection Time: 09/04/23  8:22 AM  Result Value Ref Range   TSH 2.49 0.35 - 5.50 uIU/mL  Microalbumin / creatinine urine ratio   Collection Time: 09/04/23  8:22 AM  Result Value Ref Range   Microalb, Ur <0.7 0.0 - 1.9 mg/dL   Creatinine,U 147.8 mg/dL   Microalb Creat Ratio 0.6 0.0 - 30.0 mg/g  Comprehensive metabolic panel   Collection Time: 09/04/23  8:22 AM  Result Value  Ref Range   Sodium 140  135 - 145 mEq/L   Potassium 4.4 3.5 - 5.1 mEq/L   Chloride 104 96 - 112 mEq/L   CO2 26 19 - 32 mEq/L   Glucose, Bld 89 70 - 99 mg/dL   BUN 17 6 - 23 mg/dL   Creatinine, Ser 1.61 0.40 - 1.20 mg/dL   Total Bilirubin 0.6 0.2 - 1.2 mg/dL   Alkaline Phosphatase 71 39 - 117 U/L   AST 18 0 - 37 U/L   ALT 14 0 - 35 U/L   Total Protein 6.7 6.0 - 8.3 g/dL   Albumin 4.5 3.5 - 5.2 g/dL   GFR 09.60 >45.40 mL/min   Calcium 9.5 8.4 - 10.5 mg/dL  Lipid panel   Collection Time: 09/04/23  8:22 AM  Result Value Ref Range   Cholesterol 237 (H) 0 - 200 mg/dL   Triglycerides 98.1 0.0 - 149.0 mg/dL   HDL 19.14 >78.29 mg/dL   VLDL 56.2 0.0 - 13.0 mg/dL   LDL Cholesterol 865 (H) 0 - 99 mg/dL   Total CHOL/HDL Ratio 3    NonHDL 148.10    Lab Results  Component Value Date   WBC 6.4 09/08/2021   HGB 14.3 09/08/2021   HCT 43.6 09/08/2021   MCV 91.7 09/08/2021   PLT 293.0 09/08/2021    Assessment & Plan:   Problem List Items Addressed This Visit     HLD (hyperlipidemia)   Chronic, stable on pravastatin . 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 She overall feels well with this - denies palpitations or shortness of breath however does endorse intermittent exertional dyspnea and chest tightness when more active.  Recent labs reassuring, continue low potency pravastatin  for the time being.  Will order heart monitor 2 wk Zio patch to further quantify amount of time she remains bradycardic and severity of bradycardia.  Will also refer to cardiology for further evaluation of exertional dyspnea/chest tightness.  Fmhx CAD (father deceased from MI at age 90)      Relevant Orders   LONG TERM MONITOR (3-14 DAYS)   Ambulatory referral to Cardiology   Exertional dyspnea   Ongoing.  ?anginal equivalent  Refer to cardiology for further evaluation.       Relevant Orders   Ambulatory referral to Cardiology     No orders of the  defined types were placed in this encounter.   Orders Placed This Encounter  Procedures   Ambulatory referral to Cardiology    Referral Priority:   Routine    Referral Type:   Consultation    Referral Reason:   Specialty Services Required    Number of Visits Requested:   1   LONG TERM MONITOR (3-14 DAYS)    Standing Status:   Future    Number of Occurrences:   1    Expiration Date:   12/15/2024    Where should this test be performed?:   [facility]    Does the patient have an implanted cardiac device?:   No    Prescribed days of wear:   14    Type of enrollment:   Home Enrollment    Vendor::   [employer]    Reason for Exam:   Bradycardia R00.0    Patient Instructions  I will order zio patch - expect this in the mail.  I will refer you to heart doctor for an  evaluation prior to your trip.  Continue current medicines at this time   Follow up plan: Return if symptoms worsen or fail to improve.  Claire Crick, MD

## 2023-12-23 DIAGNOSIS — M9904 Segmental and somatic dysfunction of sacral region: Secondary | ICD-10-CM | POA: Diagnosis not present

## 2023-12-23 DIAGNOSIS — M5416 Radiculopathy, lumbar region: Secondary | ICD-10-CM | POA: Diagnosis not present

## 2023-12-23 DIAGNOSIS — M9903 Segmental and somatic dysfunction of lumbar region: Secondary | ICD-10-CM | POA: Diagnosis not present

## 2023-12-23 DIAGNOSIS — M62830 Muscle spasm of back: Secondary | ICD-10-CM | POA: Diagnosis not present

## 2024-01-09 DIAGNOSIS — R001 Bradycardia, unspecified: Secondary | ICD-10-CM | POA: Diagnosis not present

## 2024-01-12 DIAGNOSIS — R001 Bradycardia, unspecified: Secondary | ICD-10-CM

## 2024-01-14 ENCOUNTER — Ambulatory Visit: Payer: Self-pay | Admitting: Family Medicine

## 2024-01-14 DIAGNOSIS — R001 Bradycardia, unspecified: Secondary | ICD-10-CM

## 2024-01-21 DIAGNOSIS — M9903 Segmental and somatic dysfunction of lumbar region: Secondary | ICD-10-CM | POA: Diagnosis not present

## 2024-01-21 DIAGNOSIS — M5416 Radiculopathy, lumbar region: Secondary | ICD-10-CM | POA: Diagnosis not present

## 2024-01-21 DIAGNOSIS — M62830 Muscle spasm of back: Secondary | ICD-10-CM | POA: Diagnosis not present

## 2024-01-21 DIAGNOSIS — M9904 Segmental and somatic dysfunction of sacral region: Secondary | ICD-10-CM | POA: Diagnosis not present

## 2024-01-24 ENCOUNTER — Ambulatory Visit: Admitting: Family Medicine

## 2024-03-09 DIAGNOSIS — M9903 Segmental and somatic dysfunction of lumbar region: Secondary | ICD-10-CM | POA: Diagnosis not present

## 2024-03-09 DIAGNOSIS — M5416 Radiculopathy, lumbar region: Secondary | ICD-10-CM | POA: Diagnosis not present

## 2024-03-09 DIAGNOSIS — M62830 Muscle spasm of back: Secondary | ICD-10-CM | POA: Diagnosis not present

## 2024-03-09 DIAGNOSIS — M9904 Segmental and somatic dysfunction of sacral region: Secondary | ICD-10-CM | POA: Diagnosis not present

## 2024-03-21 ENCOUNTER — Other Ambulatory Visit: Payer: Self-pay | Admitting: Family Medicine

## 2024-03-21 DIAGNOSIS — E7800 Pure hypercholesterolemia, unspecified: Secondary | ICD-10-CM

## 2024-03-23 ENCOUNTER — Other Ambulatory Visit (INDEPENDENT_AMBULATORY_CARE_PROVIDER_SITE_OTHER): Payer: Medicare Other

## 2024-03-23 DIAGNOSIS — E7800 Pure hypercholesterolemia, unspecified: Secondary | ICD-10-CM | POA: Diagnosis not present

## 2024-03-23 LAB — COMPREHENSIVE METABOLIC PANEL WITH GFR
ALT: 12 U/L (ref 0–35)
AST: 16 U/L (ref 0–37)
Albumin: 4.4 g/dL (ref 3.5–5.2)
Alkaline Phosphatase: 67 U/L (ref 39–117)
BUN: 13 mg/dL (ref 6–23)
CO2: 28 meq/L (ref 19–32)
Calcium: 9.5 mg/dL (ref 8.4–10.5)
Chloride: 103 meq/L (ref 96–112)
Creatinine, Ser: 0.76 mg/dL (ref 0.40–1.20)
GFR: 75.69 mL/min (ref >60.00)
Glucose, Bld: 94 mg/dL (ref 70–99)
Potassium: 4.1 meq/L (ref 3.5–5.1)
Sodium: 141 meq/L (ref 135–145)
Total Bilirubin: 0.6 mg/dL (ref 0.2–1.2)
Total Protein: 7 g/dL (ref 6.0–8.3)

## 2024-03-23 LAB — LIPID PANEL
Cholesterol: 203 mg/dL — ABNORMAL HIGH (ref 0–200)
HDL: 83 mg/dL (ref >39.00)
LDL Cholesterol: 99 mg/dL (ref 0–99)
NonHDL: 120.28
Total CHOL/HDL Ratio: 2
Triglycerides: 105 mg/dL (ref 0.0–149.0)
VLDL: 21 mg/dL (ref 0.0–40.0)

## 2024-03-25 ENCOUNTER — Ambulatory Visit: Payer: Self-pay | Admitting: Family Medicine

## 2024-04-06 DIAGNOSIS — M9904 Segmental and somatic dysfunction of sacral region: Secondary | ICD-10-CM | POA: Diagnosis not present

## 2024-04-06 DIAGNOSIS — M9903 Segmental and somatic dysfunction of lumbar region: Secondary | ICD-10-CM | POA: Diagnosis not present

## 2024-04-06 DIAGNOSIS — M5416 Radiculopathy, lumbar region: Secondary | ICD-10-CM | POA: Diagnosis not present

## 2024-04-06 DIAGNOSIS — M62830 Muscle spasm of back: Secondary | ICD-10-CM | POA: Diagnosis not present

## 2024-04-20 ENCOUNTER — Ambulatory Visit (HOSPITAL_COMMUNITY)
Admission: RE | Admit: 2024-04-20 | Discharge: 2024-04-20 | Disposition: A | Discharge status: Home / Self Care | Payer: Self-pay | Source: Ambulatory Visit | Attending: Cardiology | Admitting: Cardiology

## 2024-04-20 ENCOUNTER — Ambulatory Visit: Attending: Cardiology | Admitting: Cardiology

## 2024-04-20 ENCOUNTER — Encounter: Payer: Self-pay | Admitting: Cardiology

## 2024-04-20 VITALS — BP 132/68 | HR 56 | Ht 63.0 in | Wt 172.0 lb

## 2024-04-20 DIAGNOSIS — E782 Mixed hyperlipidemia: Secondary | ICD-10-CM | POA: Diagnosis not present

## 2024-04-20 DIAGNOSIS — R0609 Other forms of dyspnea: Secondary | ICD-10-CM | POA: Insufficient documentation

## 2024-04-20 DIAGNOSIS — R001 Bradycardia, unspecified: Secondary | ICD-10-CM | POA: Insufficient documentation

## 2024-04-20 NOTE — Patient Instructions (Signed)
  Lab Work: proBNP  If you have labs (blood work) drawn today and your tests are completely normal, you will receive your results only by: MyChart Message (if you have MyChart) OR A paper copy in the mail If you have any lab test that is abnormal or we need to change your treatment, we will call you to review the results.  Testing/Procedures: ECHOCARDIOGRAM  Your physician has requested that you have an echocardiogram. Echocardiography is a painless test that uses sound waves to create images of your heart. It provides your doctor with information about the size and shape of your heart and how well your heart's chambers and valves are working. This procedure takes approximately one hour. There are no restrictions for this procedure. Please do NOT wear cologne, perfume, aftershave, or lotions (deodorant is allowed). Please arrive 15 minutes prior to your appointment time.  Please note: We ask at that you not bring children with you during ultrasound (echo/ vascular) testing. Due to room size and safety concerns, children are not allowed in the ultrasound rooms during exams. Our front office staff cannot provide observation of children in our lobby area while testing is being conducted. An adult accompanying a patient to their appointment will only be allowed in the ultrasound room at the discretion of the ultrasound technician under special circumstances. We apologize for any inconvenience.   GXT   Exercise Tolerance Test  Please arrive 15 minutes prior to your appointment time for registration and insurance purposes.  The test will take approximately 45 minutes to complete.  How to prepare for your Exercise Stress Test: Do bring a list of your current medications with you.  If not listed below, you may take your medications as normal. Do wear comfortable clothes (no dresses or overalls) and walking shoes, tennis shoes preferred (no heels or open toed shoes are allowed) Do Not wear  cologne, perfume, aftershave or lotions (deodorant is allowed).  If these instructions are not followed, your test will have to be rescheduled.  If you have questions or concerns about your appointment, you can call the Stress Lab at [phone].  If you cannot keep your appointment, please provide 24 hours notification to the Stress Lab, to avoid a possible $50 charge to your account.   CARDIAC CALCIUM  SCORE   CT scanning for a cardiac calcium  score (CAT scanning), is a noninvasive, special x-ray that produces cross-sectional images of the body using x-rays and a computer. CT scans help physicians diagnose and treat medical conditions. For some CT exams, a contrast material is used to enhance visibility in the area of the body being studied. CT scans provide greater clarity and reveal more details than regular x-ray exams.   Your physician has requested that you have a coronary calcium  score performed. This is not covered by insurance and will be an out-of-pocket cost of approximately $99.  Follow-Up: At [facility], you and your health needs are our priority.  As part of our continuing mission to provide you with exceptional heart care, our providers are all part of one team.  This team includes your primary Cardiologist (physician) and Advanced Practice Providers or APPs (Physician Assistants and Nurse Practitioners) who all work together to provide you with the care you need, when you need it.  Your next appointment:   AS NEEDED   Provider:   Newman JINNY Lawrence, MD

## 2024-04-20 NOTE — Progress Notes (Signed)
 Cardiology Office Note:  .   Date:  04/20/2024  ID:  Victoria Morrison, DOB 07-Oct-1945, MRN [id number] PCP: Rilla Baller, MD  [facility] Providers Cardiologist:  Newman Lawrence, MD PCP: Rilla Baller, MD  Chief Complaint  Patient presents with   Bradycardia   Shortness of Breath     Victoria Morrison is a 78 y.o. female with bradycardia, hyperlipidemia, dyspnea on exertion  Discussed the use of AI scribe software for clinical note transcription with the patient, who gave verbal consent to proceed.  History of Present Illness Victoria Morrison is a 78 year old female who presents with shortness of breath and low heart rate. She is accompanied by her daughter. She was referred by Dr. Rilla for evaluation of her low heart rate and shortness of breath.  She experiences shortness of breath, especially during activities, with an inability to catch her breath. Her heart rate occasionally increases during these episodes. Her blood pressure has been low for several months.  Her watch alerts her when her heart rate drops below 44 beats per minute, a pattern observed for about a year. An EKG in January confirmed a low heart rate, while a 2014 EKG showed a heart rate of 64-65 beats per minute.  She participates in water aerobics five mornings a week for 40-45 minutes and sometimes attends an active adult aerobics class at the [facility].  She experiences a 'real ache' in her neck, not related to exertion, similar to a kink in the neck.  Her medications include 5 mg of amlodipine  and 40 mg of pravastatin . Her father died of a heart attack at 73.  She lives with her daughter, is active in water aerobics, church activities, Bible studies, and book clubs. She does not smoke, drinks wine two to three times a week, and consumes caffeine through coffee and diet soda.  No chest pain, lightheadedness, or syncope. Occasional shortness of breath and palpitations. Uncertain about snoring but notes her  father was a heavy snorer. No symptoms associated with her low heart rate.      Vitals:   04/20/24 1104  BP: 132/68  Pulse: (!) 56  SpO2: 95%      Review of Systems  Cardiovascular:  Positive for dyspnea on exertion. Negative for chest pain, leg swelling, palpitations and syncope.        Studies Reviewed: SABRA        EKG 04/20/2024: Sinus rhythm 56 bpm with sinus arrhythmia Nonspecific ST and T wave abnormality No significant change compared to previous EKG on 09/24/2023  Monitor 12/2023: HR 38 - 167, average 61 bpm. 11 nonsustained SVT, longest 16 beats. Frequent supraventricular ectopy, 8% Rare ventricular ectopy. No atrial fibrillation. No sustained arrhythmias.  Labs 02/2024: Chol 203, TG 105, HDL 83, LDL 99 Cr 0.76 TSH 2.4  2023: Hb 14.3    Physical Exam Vitals and nursing note reviewed.  Constitutional:      General: She is not in acute distress. Neck:     Vascular: No JVD.  Cardiovascular:     Rate and Rhythm: Regular rhythm. Bradycardia present.     Heart sounds: Normal heart sounds. No murmur heard. Pulmonary:     Effort: Pulmonary effort is normal.     Breath sounds: Normal breath sounds. No wheezing or rales.  Musculoskeletal:     Right lower leg: No tenderness. No edema.     Left lower leg: No edema.      VISIT DIAGNOSES:   ICD-10-CM   1. Exertional  dyspnea  R06.09 EKG 12-Lead    ECHOCARDIOGRAM COMPLETE    Pro b natriuretic peptide (BNP)    2. Bradycardia  R00.1 EKG 12-Lead    EXERCISE TOLERANCE TEST (ETT)    Cardiac Stress Test: Informed Consent Details: Physician/Practitioner Attestation; Transcribe to consent form and obtain patient signature    CANCELED: EXERCISE TOLERANCE TEST (ETT)    3. Mixed hyperlipidemia  E78.2 CT CARDIAC SCORING (SELF PAY ONLY)       Victoria Morrison is a 78 y.o. female with bradycardia, hyperlipidemia, dyspnea on exertion Assessment and Plan Assessment & Plan Sinus bradycardia with palpitations and  exertional shortness of breath: Bradycardia likely physiological due to conditioning, though heart rate in 40s is low for her age. TSH normal, ruling out hypothyroidism. Possible mild sleep apnea. Previous EKG in 2014 showed heart rate of 64, recent monitor average 61 with palpitations. No lightheadedness or syncope. Shortness of breath not due to bradycardia. Sinus node wear not significant. - Change watch settings to alert if heart rate consistently <40 bpm. - Order echocardiogram to assess heart function. - Order proBNP to check for congestive heart failure. - Order treadmill exercise stress test to evaluate heart rate response to exercise.  Mixed hyperlipidemia: Cholesterol slightly elevated, excellent HDL, LDL could be lower. Family history of heart disease. On pravastatin . Consider coronary artery calcium  score for risk assessment. If elevated, consider switching to high intensity statin.      Informed Consent   Shared Decision Making/Informed Consent The risks [chest pain, shortness of breath, cardiac arrhythmias, dizziness, blood pressure fluctuations, myocardial infarction, stroke/transient ischemic attack, and life-threatening complications (estimated to be 1 in 10,000)], benefits (risk stratification, diagnosing coronary artery disease, treatment guidance) and alternatives of an exercise tolerance test were discussed in detail with Victoria Morrison and she agrees to proceed.         F/u as needed, unless any significant abnormality found on above testing.  Signed, Newman JINNY Lawrence, MD

## 2024-04-21 ENCOUNTER — Telehealth (HOSPITAL_COMMUNITY): Payer: Self-pay | Admitting: *Deleted

## 2024-04-21 ENCOUNTER — Encounter (HOSPITAL_COMMUNITY): Payer: Self-pay

## 2024-04-21 LAB — PRO B NATRIURETIC PEPTIDE: NT-Pro BNP: 167 pg/mL (ref 0–738)

## 2024-04-21 NOTE — Telephone Encounter (Signed)
 Left message on voicemail in reference to upcoming appointment scheduled for 04/23/24  Phone number given for a call back so details instructions can be given. Letter sent to [facility] with instructions.  Claudene Ronal Quale, RN

## 2024-04-23 ENCOUNTER — Ambulatory Visit (HOSPITAL_COMMUNITY)
Admission: RE | Admit: 2024-04-23 | Discharge: 2024-04-23 | Disposition: A | Discharge status: Home / Self Care | Source: Ambulatory Visit | Attending: Cardiology | Admitting: Cardiology

## 2024-04-23 ENCOUNTER — Ambulatory Visit: Payer: Self-pay | Admitting: Cardiology

## 2024-04-23 DIAGNOSIS — R001 Bradycardia, unspecified: Secondary | ICD-10-CM | POA: Insufficient documentation

## 2024-04-23 LAB — EXERCISE TOLERANCE TEST
Angina Index: 0
Duke Treadmill Score: 9
Estimated workload: 10.4
Exercise duration (min): 9 min
Exercise duration (sec): 0 s
MPHR: 143 {beats}/min
Peak HR: 146 {beats}/min
Percent HR: 102 %
Rest HR: 72 {beats}/min
ST Depression (mm): 0 mm

## 2024-04-23 NOTE — Progress Notes (Signed)
 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  If okay with the patient, recommend switching pravastatin  to Crestor  10 mg daily, uptitrate as per PCP in future, to achieve LDL <80 (given that HDL is 83).  Thanks MJP

## 2024-04-24 MED ORDER — ROSUVASTATIN CALCIUM 10 MG PO TABS: 10.0000 mg | ORAL_TABLET | Freq: Every day | ORAL | 3 refills | Status: DC

## 2024-04-24 NOTE — Telephone Encounter (Signed)
 Patient returned RN's call regarding results.

## 2024-04-24 NOTE — Progress Notes (Signed)
 Adequate heart rate response on stress test with no EKG changes. Blood pressure was elevated throughout the stress test.  This may need close monitoring. Recommend regular blood pressure checks at home and follow-up with Dr. Rilla.  Thanks MJP

## 2024-04-26 ENCOUNTER — Encounter: Payer: Self-pay | Admitting: Family Medicine

## 2024-04-26 DIAGNOSIS — R931 Abnormal findings on diagnostic imaging of heart and coronary circulation: Secondary | ICD-10-CM

## 2024-04-26 DIAGNOSIS — E7800 Pure hypercholesterolemia, unspecified: Secondary | ICD-10-CM

## 2024-04-28 ENCOUNTER — Other Ambulatory Visit: Payer: Self-pay | Admitting: Family Medicine

## 2024-04-28 DIAGNOSIS — Z1231 Encounter for screening mammogram for malignant neoplasm of breast: Secondary | ICD-10-CM

## 2024-05-01 ENCOUNTER — Ambulatory Visit

## 2024-05-01 ENCOUNTER — Ambulatory Visit (INDEPENDENT_AMBULATORY_CARE_PROVIDER_SITE_OTHER): Admitting: Podiatry

## 2024-05-01 ENCOUNTER — Encounter: Payer: Self-pay | Admitting: Podiatry

## 2024-05-01 VITALS — Ht 63.0 in | Wt 172.0 lb

## 2024-05-01 DIAGNOSIS — M7752 Other enthesopathy of left foot: Secondary | ICD-10-CM

## 2024-05-01 DIAGNOSIS — M7751 Other enthesopathy of right foot: Secondary | ICD-10-CM

## 2024-05-01 DIAGNOSIS — M722 Plantar fascial fibromatosis: Secondary | ICD-10-CM

## 2024-05-01 NOTE — Progress Notes (Signed)
 Chief Complaint  Patient presents with   Foot Pain    Pt is here due to bilateral foot issues , she states that she has a neuroma on the right foot, pain to the bottom of the foot as well, left foot has a sharp pain on and off to the heel.    HPI: 78 y.o. female presenting today for bilateral foot pain.  History of intermittent plantar fasciitis bilateral as well as Morton's neuroma to the second intermetatarsal space of the right foot.  This has been tolerated and managed with orthotics and good shoes.  She also has a symptomatic corn to the medial aspect of the right fifth toe.  Past Medical History:  Diagnosis Date   Allergy    SEASONAL   Asthma    hx asthma in the 80's after pneumonia    Asymptomatic bacteriuria    Cancer (HCC)    SKIN   COVID-19 virus infection 06/2020   Diverticulosis    Environmental allergies    dust; pet dander; pollen   History of asthma 1980s   History of MRSA infection 2005   abdomen   History of pneumonia 1980s   History of rheumatoid arthritis 2009   in remission, off MTX since 2005, seronegative   HLD (hyperlipidemia)    HTN (hypertension)    Hx of adenomatous colonic polyps    Hydradenitis 2005   Osteoarthritis 2009   bilateral hands/knees s/p synvisc injections x3 ([facility])   Osteopenia 12/2012   dexa with T score -1.9   Osteopenia    Postmenopausal 2000   Seasonal allergic rhinitis     Past Surgical History:  Procedure Laterality Date   APPENDECTOMY  1954   COLONOSCOPY  12/2013   3 TAs, diverticulosis, rpt 5 yrs Oma)   COLONOSCOPY  08/2019   3 HP, 1 TA, mod diverticulosis rpt 5 yrs Oma)   DEXA  12/2012   T score -1.3 femur, -1.9 radius   MEDIAL PARTIAL KNEE REPLACEMENT Bilateral 09/2013   Dr Maryl   MENISCUS REPAIR  2009   Left   TONSILLECTOMY     childhood    Allergies  Allergen Reactions   Asa [Aspirin] Hives    Ok to take a few-but not too many   Penicillins Hives     Physical Exam: General: The  patient is alert and oriented x3 in no acute distress.  Dermatology: Skin is warm, dry and supple bilateral lower extremities.  Hyperkeratotic symptomatic corn noted to the medial aspect of the right fifth digit  Vascular: Palpable pedal pulses bilaterally. Capillary refill within normal limits.  No appreciable edema.  No erythema.  Neurological: Grossly intact via light touch  Musculoskeletal Exam: No pedal deformities noted.  Diffuse generalized mild tenderness throughout palpation to the bilateral heels as well as the second intermetatarsal space of the right foot  Radiographic Exam B/L feet 05/01/2024:  Normal osseous mineralization. Joint spaces preserved.  No fractures or osseous irregularities noted.  Impression: Negative  Assessment/Plan of Care: 1.  H/o Morton's neuroma second intermetatarsal space right foot 2.  H/o Intermittent plantar fasciitis bilateral 3.  Symptomatic corn medial aspect of the right fifth toe  -Patient evaluated.  X-rays reviewed -Excisional debridement of the corn lesion was performed today using a tissue nipper without incident or bleeding -Continue conservative care and management for the Morton's neuroma and plantar fasciitis bilateral.  Declined injection -Continue arch supports with good supportive wide fitting tennis shoes -Return to clinic as needed  Thresa EMERSON Sar, DPM [facility]  Dr. Thresa EMERSON Sar, DPM    [address] [place], [place] [address]                Office [phone]  Fax [phone]

## 2024-05-04 DIAGNOSIS — M5416 Radiculopathy, lumbar region: Secondary | ICD-10-CM | POA: Diagnosis not present

## 2024-05-04 DIAGNOSIS — M62830 Muscle spasm of back: Secondary | ICD-10-CM | POA: Diagnosis not present

## 2024-05-04 DIAGNOSIS — M9904 Segmental and somatic dysfunction of sacral region: Secondary | ICD-10-CM | POA: Diagnosis not present

## 2024-05-04 DIAGNOSIS — M9903 Segmental and somatic dysfunction of lumbar region: Secondary | ICD-10-CM | POA: Diagnosis not present

## 2024-05-05 ENCOUNTER — Ambulatory Visit (HOSPITAL_COMMUNITY)
Admission: RE | Admit: 2024-05-05 | Discharge: 2024-05-05 | Disposition: A | Discharge status: Home / Self Care | Source: Ambulatory Visit | Attending: Cardiology | Admitting: Cardiology

## 2024-05-05 DIAGNOSIS — R0609 Other forms of dyspnea: Secondary | ICD-10-CM | POA: Diagnosis not present

## 2024-05-05 LAB — ECHOCARDIOGRAM COMPLETE
Area-P 1/2: 3.15 cm2
P 1/2 time: 767 ms
S' Lateral: 2.5 cm

## 2024-05-10 DIAGNOSIS — R931 Abnormal findings on diagnostic imaging of heart and coronary circulation: Secondary | ICD-10-CM | POA: Insufficient documentation

## 2024-05-25 ENCOUNTER — Ambulatory Visit

## 2024-05-28 ENCOUNTER — Ambulatory Visit
Admission: RE | Admit: 2024-05-28 | Discharge: 2024-05-28 | Disposition: A | Discharge status: Home / Self Care | Source: Ambulatory Visit | Attending: Family Medicine | Admitting: Family Medicine

## 2024-05-28 DIAGNOSIS — Z1231 Encounter for screening mammogram for malignant neoplasm of breast: Secondary | ICD-10-CM

## 2024-06-01 DIAGNOSIS — M9904 Segmental and somatic dysfunction of sacral region: Secondary | ICD-10-CM | POA: Diagnosis not present

## 2024-06-01 DIAGNOSIS — M62830 Muscle spasm of back: Secondary | ICD-10-CM | POA: Diagnosis not present

## 2024-06-01 DIAGNOSIS — M5416 Radiculopathy, lumbar region: Secondary | ICD-10-CM | POA: Diagnosis not present

## 2024-06-01 DIAGNOSIS — M9903 Segmental and somatic dysfunction of lumbar region: Secondary | ICD-10-CM | POA: Diagnosis not present

## 2024-06-03 ENCOUNTER — Ambulatory Visit: Payer: Self-pay | Admitting: Family Medicine

## 2024-06-29 DIAGNOSIS — M9904 Segmental and somatic dysfunction of sacral region: Secondary | ICD-10-CM | POA: Diagnosis not present

## 2024-06-29 DIAGNOSIS — M9903 Segmental and somatic dysfunction of lumbar region: Secondary | ICD-10-CM | POA: Diagnosis not present

## 2024-06-29 DIAGNOSIS — M62830 Muscle spasm of back: Secondary | ICD-10-CM | POA: Diagnosis not present

## 2024-06-29 DIAGNOSIS — M5416 Radiculopathy, lumbar region: Secondary | ICD-10-CM | POA: Diagnosis not present

## 2024-07-28 ENCOUNTER — Encounter: Payer: Self-pay | Admitting: Family Medicine

## 2024-07-29 DIAGNOSIS — M5416 Radiculopathy, lumbar region: Secondary | ICD-10-CM | POA: Diagnosis not present

## 2024-07-29 DIAGNOSIS — M62830 Muscle spasm of back: Secondary | ICD-10-CM | POA: Diagnosis not present

## 2024-07-29 DIAGNOSIS — M9904 Segmental and somatic dysfunction of sacral region: Secondary | ICD-10-CM | POA: Diagnosis not present

## 2024-07-29 DIAGNOSIS — M9903 Segmental and somatic dysfunction of lumbar region: Secondary | ICD-10-CM | POA: Diagnosis not present

## 2024-09-14 ENCOUNTER — Other Ambulatory Visit: Payer: Self-pay | Admitting: Family Medicine

## 2024-09-14 DIAGNOSIS — M85831 Other specified disorders of bone density and structure, right forearm: Secondary | ICD-10-CM

## 2024-09-14 DIAGNOSIS — I10 Essential (primary) hypertension: Secondary | ICD-10-CM

## 2024-09-14 DIAGNOSIS — E7800 Pure hypercholesterolemia, unspecified: Secondary | ICD-10-CM

## 2024-09-18 ENCOUNTER — Other Ambulatory Visit (INDEPENDENT_AMBULATORY_CARE_PROVIDER_SITE_OTHER): Payer: Medicare Other

## 2024-09-18 DIAGNOSIS — E7800 Pure hypercholesterolemia, unspecified: Secondary | ICD-10-CM | POA: Diagnosis not present

## 2024-09-18 DIAGNOSIS — I10 Essential (primary) hypertension: Secondary | ICD-10-CM

## 2024-09-18 DIAGNOSIS — M85831 Other specified disorders of bone density and structure, right forearm: Secondary | ICD-10-CM | POA: Diagnosis not present

## 2024-09-18 LAB — MICROALBUMIN / CREATININE URINE RATIO
Creatinine,U: 83.6 mg/dL
Microalb Creat Ratio: UNDETERMINED mg/g (ref 0.0–30.0)
Microalb, Ur: <0.7 mg/dL

## 2024-09-18 LAB — COMPREHENSIVE METABOLIC PANEL WITH GFR
ALT: 14 U/L (ref 3–35)
AST: 17 U/L (ref 5–37)
Albumin: 4.2 g/dL (ref 3.5–5.2)
Alkaline Phosphatase: 69 U/L (ref 39–117)
BUN: 17 mg/dL (ref 6–23)
CO2: 30 meq/L (ref 19–32)
Calcium: 9.3 mg/dL (ref 8.4–10.5)
Chloride: 106 meq/L (ref 96–112)
Creatinine, Ser: 0.79 mg/dL (ref 0.40–1.20)
GFR: 72 mL/min
Glucose, Bld: 92 mg/dL (ref 70–99)
Potassium: 4.2 meq/L (ref 3.5–5.1)
Sodium: 141 meq/L (ref 135–145)
Total Bilirubin: 0.6 mg/dL (ref 0.2–1.2)
Total Protein: 6.4 g/dL (ref 6.0–8.3)

## 2024-09-18 LAB — LIPID PANEL
Cholesterol: 209 mg/dL — ABNORMAL HIGH (ref 28–200)
HDL: 85.7 mg/dL
LDL Cholesterol: 105 mg/dL — ABNORMAL HIGH (ref 10–99)
NonHDL: 123.56
Total CHOL/HDL Ratio: 2
Triglycerides: 93 mg/dL (ref 10.0–149.0)
VLDL: 18.6 mg/dL (ref 0.0–40.0)

## 2024-09-18 LAB — VITAMIN D 25 HYDROXY (VIT D DEFICIENCY, FRACTURES): VITD: 29.46 ng/mL — ABNORMAL LOW (ref 30.00–100.00)

## 2024-09-21 ENCOUNTER — Ambulatory Visit: Payer: Self-pay | Admitting: Family Medicine

## 2024-09-23 ENCOUNTER — Ambulatory Visit: Payer: Medicare Other

## 2024-09-23 VITALS — Ht 63.0 in | Wt 172.0 lb

## 2024-09-23 DIAGNOSIS — Z0000 Encounter for general adult medical examination without abnormal findings: Secondary | ICD-10-CM

## 2024-09-23 NOTE — Patient Instructions (Signed)
 Victoria Morrison,  Thank you for taking the time for your Medicare Wellness Visit. I appreciate your continued commitment to your health goals. Please review the care plan we discussed, and feel free to reach out if I can assist you further.  Please note that Annual Wellness Visits do not include a physical exam. Some assessments may be limited, especially if the visit was conducted virtually. If needed, we may recommend an in-person follow-up with your provider.  Ongoing Care Seeing your primary care provider every 3 to 6 months helps us  monitor your health and provide consistent, personalized care.   Referrals If a referral was made during today's visit and you haven't received any updates within two weeks, please contact the referred provider directly to check on the status.  Recommended Screenings:  Health Maintenance  Topic Date Due   Zoster (Shingles) Vaccine (1 of 2) 01/01/1997   DTaP/Tdap/Td vaccine (2 - Tdap) 12/05/2022   Flu Shot  03/27/2024   COVID-19 Vaccine (1 - 2025-26 season) Never done   Colon Cancer Screening  08/30/2024   Medicare Annual Wellness Visit  09/22/2024   Breast Cancer Screening  05/28/2025   Pneumococcal Vaccine for age over 8  Completed   Osteoporosis screening with Bone Density Scan  Completed   Hepatitis C Screening  Completed   Meningitis B Vaccine  Aged Out       09/19/2024   10:19 AM  Advanced Directives  Does Patient Have a Medical Advance Directive? Yes  Type of Estate Agent of [place];Living will  Copy of Healthcare Power of Attorney in Chart? Yes - validated most recent copy scanned in chart (See row information)    Vision: Annual vision screenings are recommended for early detection of glaucoma, cataracts, and diabetic retinopathy. These exams can also reveal signs of chronic conditions such as diabetes and high blood pressure.  Dental: Annual dental screenings help detect early signs of oral cancer, gum disease, and  other conditions linked to overall health, including heart disease and diabetes.  Please see the attached documents for additional preventive care recommendations.

## 2024-09-23 NOTE — Progress Notes (Signed)
 "  No chief complaint on file.    Subjective:   Victoria Morrison is a 79 y.o. female who presents for a Medicare Annual Wellness Visit.  Visit info / Clinical Intake: Medicare Wellness Visit Type:: Subsequent Annual Wellness Visit Persons participating in visit and providing information:: patient Medicare Wellness Visit Mode:: Telephone If telephone:: video declined Since this visit was completed virtually, some vitals may be partially provided or unavailable. Missing vitals are due to the limitations of the virtual format.: Unable to obtain vitals - no equipment If Telephone or Video please confirm:: I connected with patient using audio/video enable telemedicine. I verified patient identity with two identifiers, discussed telehealth limitations, and patient agreed to proceed. Patient Location:: home Provider Location:: home office Interpreter Needed?: No Pre-visit prep was completed: yes AWV questionnaire completed by patient prior to visit?: yes Date:: 09/19/24 Living arrangements:: (Patient-Rptd) with family/others Patient's Overall Health Status Rating: (Patient-Rptd) very good Typical amount of pain: (Patient-Rptd) some Does pain affect daily life?: (Patient-Rptd) no Are you currently prescribed opioids?: no  Dietary Habits and Nutritional Risks How many meals a day?: (Patient-Rptd) 3 Eats fruit and vegetables daily?: (Patient-Rptd) yes Most meals are obtained by: (Patient-Rptd) preparing own meals In the last 2 weeks, have you had any of the following?: none Diabetic:: no  Functional Status Activities of Daily Living (to include ambulation/medication): (Patient-Rptd) Independent Ambulation: (Patient-Rptd) Independent Medication Administration: (Patient-Rptd) Independent Home Management (perform basic housework or laundry): (Patient-Rptd) Independent Manage your own finances?: (Patient-Rptd) yes Primary transportation is: (Patient-Rptd) driving Concerns about vision?: no  *vision screening is required for WTM* Concerns about hearing?: no  Fall Screening Falls in the past year?: (Patient-Rptd) 0 Number of falls in past year: 0 Was there an injury with Fall?: 0 Fall Risk Category Calculator: 0 Patient Fall Risk Level: Low Fall Risk  Fall Risk Patient at Risk for Falls Due to: No Fall Risks Fall risk Follow up: Falls evaluation completed; Education provided; Falls prevention discussed  Home and Transportation Safety: All rugs have non-skid backing?: (Patient-Rptd) N/A, no rugs All stairs or steps have railings?: (Patient-Rptd) yes Grab bars in the bathtub or shower?: (!) (Patient-Rptd) no Have non-skid surface in bathtub or shower?: (Patient-Rptd) yes Good home lighting?: (Patient-Rptd) yes Regular seat belt use?: (Patient-Rptd) yes Hospital stays in the last year:: (Patient-Rptd) no  Cognitive Assessment Difficulty concentrating, remembering, or making decisions? : (Patient-Rptd) no Will 6CIT or Mini Cog be Completed: yes What year is it?: 0 points What month is it?: 0 points Give patient an address phrase to remember (5 components): [address] [place]  About what time is it?: 0 points Count backwards from 20 to 1: 0 points Say the months of the year in reverse: 0 points Repeat the address phrase from earlier: 0 points 6 CIT Score: 0 points  Advance Directives (For Healthcare) Does Patient Have a Medical Advance Directive?: Yes Type of Advance Directive: Healthcare Power of [place]; Living will Copy of Healthcare Power of Attorney in Chart?: Yes - validated most recent copy scanned in chart (See row information) Copy of Living Will in Chart?: Yes - validated most recent copy scanned in chart (See row information)  Reviewed/Updated  Reviewed/Updated: Reviewed All (Medical, Surgical, Family, Medications, Allergies, Care Teams, Patient Goals)    Allergies (verified) Asa [aspirin] and Penicillins   Current Medications  (verified) Outpatient Encounter Medications as of 09/23/2024  Medication Sig   amLODipine  (NORVASC ) 5 MG tablet Take 1 tablet (5 mg total) by mouth daily.   NON  FORMULARY DoTerra Lifelong Vitality Supplement   pravastatin  (PRAVACHOL ) 40 MG tablet Take 1 tablet (40 mg total) by mouth daily.   rosuvastatin  (CRESTOR ) 10 MG tablet Take 1 tablet (10 mg total) by mouth daily.   No facility-administered encounter medications on file as of 09/23/2024.    History: Past Medical History:  Diagnosis Date   Allergy    SEASONAL   Asthma    hx asthma in the 80's after pneumonia    Asymptomatic bacteriuria    Cancer (HCC)    SKIN   COVID-19 virus infection 06/2020   Diverticulosis    Environmental allergies    dust; pet dander; pollen   Heart murmur    History of asthma 1980s   History of MRSA infection 2005   abdomen   History of pneumonia 1980s   History of rheumatoid arthritis 2009   in remission, off MTX since 2005, seronegative   HLD (hyperlipidemia)    HTN (hypertension)    Hx of adenomatous colonic polyps    Hydradenitis 2005   Osteoarthritis 2009   bilateral hands/knees s/p synvisc injections x3 ([facility])   Osteopenia 12/2012   dexa with T score -1.9   Osteopenia    Postmenopausal 2000   Seasonal allergic rhinitis    Past Surgical History:  Procedure Laterality Date   APPENDECTOMY  1954   COLONOSCOPY  12/2013   3 TAs, diverticulosis, rpt 5 yrs Oma)   COLONOSCOPY  08/2019   3 HP, 1 TA, mod diverticulosis rpt 5 yrs Oma)   DEXA  12/2012   T score -1.3 femur, -1.9 radius   JOINT REPLACEMENT  2015   MEDIAL PARTIAL KNEE REPLACEMENT Bilateral 09/2013   Dr Maryl   MENISCUS REPAIR  2009   Left   TONSILLECTOMY     childhood   Family History  Problem Relation Age of Onset   Cancer Mother 46       breast   Breast cancer Mother 82 - 44   Varicose Veins Mother    Alcoholism Father    CAD Father 54       MI   Heart disease Father    Cancer Maternal  Grandmother 61       breast (or unsure)   Breast cancer Maternal Grandmother    Cancer Brother 63       lung (smoker)   Stroke Neg Hx    Diabetes Neg Hx    Colon cancer Neg Hx    Rectal cancer Neg Hx    Stomach cancer Neg Hx    Colon polyps Neg Hx    Esophageal cancer Neg Hx    Social History   Occupational History   Not on file  Tobacco Use   Smoking status: Former    Current packs/day: 0.00    Types: Cigarettes    Quit date: 08/27/1968    Years since quitting: 56.1   Smokeless tobacco: Never  Vaping Use   Vaping status: Never Used  Substance and Sexual Activity   Alcohol use: Not Currently    Alcohol/week: 6.0 standard drinks of alcohol    Comment: Regular-wine 2-3 every other day   Drug use: Never   Sexual activity: Not Currently   Tobacco Counseling Counseling given: Not Answered  SDOH Screenings   Food Insecurity: No Food Insecurity (09/19/2024)  Housing: Unknown (09/19/2024)  Transportation Needs: No Transportation Needs (09/19/2024)  Utilities: Not At Risk (09/23/2024)  Alcohol Screen: Low Risk (09/19/2024)  Depression (PHQ2-9): Low Risk (09/23/2024)  Financial Resource Strain: Low Risk (09/19/2024)  Physical Activity: Insufficiently Active (09/19/2024)  Social Connections: Moderately Integrated (09/19/2024)  Stress: No Stress Concern Present (09/19/2024)  Tobacco Use: Medium Risk (09/23/2024)  Health Literacy: Adequate Health Literacy (09/23/2024)   See flowsheets for full screening details  Depression Screen PHQ 2 & 9 Depression Scale- Over the past 2 weeks, how often have you been bothered by any of the following problems? Little interest or pleasure in doing things: 0 Feeling down, depressed, or hopeless (PHQ Adolescent also includes...irritable): 0 PHQ-2 Total Score: 0 Trouble falling or staying asleep, or sleeping too much: 0 Feeling tired or having little energy: 0 Poor appetite or overeating (PHQ Adolescent also includes...weight loss): 0 Feeling bad  about yourself - or that you are a failure or have let yourself or your family down: 0 Trouble concentrating on things, such as reading the newspaper or watching television (PHQ Adolescent also includes...like school work): 1 Moving or speaking so slowly that other people could have noticed. Or the opposite - being so fidgety or restless that you have been moving around a lot more than usual: 0 Thoughts that you would be better off dead, or of hurting yourself in some way: 0 PHQ-9 Total Score: 1 If you checked off any problems, how difficult have these problems made it for you to do your work, take care of things at home, or get along with other people?: Not difficult at all     Goals Addressed             This Visit's Progress    I would like to lose 10lb and more active       Patient Stated   Not on track    09/14/2020, I will continue to walk 2 days a week for about 30 minutes.             Objective:    There were no vitals filed for this visit. There is no height or weight on file to calculate BMI.  Hearing/Vision screen No results found. Immunizations and Health Maintenance Health Maintenance  Topic Date Due   Zoster Vaccines- Shingrix (1 of 2) 01/01/1997   DTaP/Tdap/Td (2 - Tdap) 12/05/2022   Influenza Vaccine  03/27/2024   COVID-19 Vaccine (1 - 2025-26 season) Never done   Colonoscopy  08/30/2024   Medicare Annual Wellness (AWV)  09/22/2024   Mammogram  05/28/2025   Pneumococcal Vaccine: 50+ Years  Completed   Bone Density Scan  Completed   Hepatitis C Screening  Completed   Meningococcal B Vaccine  Aged Out        Assessment/Plan:  This is a routine wellness examination for [facility].  Patient Care Team: Rilla Baller, MD as PCP - General (Family Medicine) Elmira Newman PARAS, MD as PCP - Cardiology (Cardiology) Maryl Barters, MD (Inactive) as Consulting Physician (Orthopedic Surgery) Troxler, Donnice MAUL (Inactive) as Attending Physician  (Podiatry) Pa, [facility]  I have personally reviewed and noted the following in the patients chart:   Medical and social history Use of alcohol, tobacco or illicit drugs  Current medications and supplements including opioid prescriptions. Functional ability and status Nutritional status Physical activity Advanced directives List of other physicians Hospitalizations, surgeries, and ER visits in previous 12 months Vitals Screenings to include cognitive, depression, and falls Referrals and appointments  No orders of the defined types were placed in this encounter.  In addition, I have reviewed and discussed with patient certain preventive protocols, quality metrics, and  best practice recommendations. A written personalized care plan for preventive services as well as general preventive health recommendations were provided to patient.   Erminio LITTIE Saris, LPN   8/71/7973   No follow-ups on file.  After Visit Summary: (MyChart) Due to this being a telephonic visit, the after visit summary with patients personalized plan was offered to patient via MyChart   Nurse Notes: No voiced or noted concerns at this time Patient advised to keep follow-up appointment with PCP (09/25/24) Appointment(s) made: (AWV/CPE Feb 2027) Vaccines not given: Shingrix, Influenza,tdap declined today Screenings not ordered: Declined Referral/Order for Colonoscopy Will discuss Foot numbness with PCP at next visit HM Addressed: discuss cologard w/PCP  "

## 2024-09-25 ENCOUNTER — Ambulatory Visit: Payer: Medicare Other | Admitting: Family Medicine

## 2024-09-25 ENCOUNTER — Encounter: Payer: Self-pay | Admitting: Family Medicine

## 2024-09-25 VITALS — BP 148/78 | HR 56 | Temp 97.5°F | Ht 63.0 in | Wt 179.2 lb

## 2024-09-25 DIAGNOSIS — R931 Abnormal findings on diagnostic imaging of heart and coronary circulation: Secondary | ICD-10-CM

## 2024-09-25 DIAGNOSIS — Z1211 Encounter for screening for malignant neoplasm of colon: Secondary | ICD-10-CM

## 2024-09-25 DIAGNOSIS — M79672 Pain in left foot: Secondary | ICD-10-CM | POA: Insufficient documentation

## 2024-09-25 DIAGNOSIS — E785 Hyperlipidemia, unspecified: Secondary | ICD-10-CM | POA: Diagnosis not present

## 2024-09-25 DIAGNOSIS — R200 Anesthesia of skin: Secondary | ICD-10-CM

## 2024-09-25 DIAGNOSIS — I10 Essential (primary) hypertension: Secondary | ICD-10-CM

## 2024-09-25 DIAGNOSIS — M85831 Other specified disorders of bone density and structure, right forearm: Secondary | ICD-10-CM | POA: Diagnosis not present

## 2024-09-25 DIAGNOSIS — Z7189 Other specified counseling: Secondary | ICD-10-CM | POA: Diagnosis not present

## 2024-09-25 MED ORDER — AMLODIPINE BESYLATE 5 MG PO TABS: 5.0000 mg | ORAL_TABLET | Freq: Every day | ORAL | 3 refills | Status: AC

## 2024-09-25 MED ORDER — PRAVASTATIN SODIUM 40 MG PO TABS: 40.0000 mg | ORAL_TABLET | Freq: Every day | ORAL | 3 refills | Status: AC

## 2024-09-25 NOTE — Assessment & Plan Note (Signed)
"  Previously discussed    "

## 2024-09-25 NOTE — Assessment & Plan Note (Signed)
 Chronic, deteriorated control in setting of holidays, weight gain, less active. Reviewed diet and lifestyle choices to improve blood pressure control.  She prefers to work on these changes then recheck prior to increase in BP control  RTC 3 mo HTN f/u visit.

## 2024-09-25 NOTE — Assessment & Plan Note (Signed)
 Continues doterra, recently started vit D 5000 units daily.  Will be due for rpt DEXA end of 2025.

## 2024-09-25 NOTE — Progress Notes (Signed)
 " Ph: [phone] Fax: [phone]   Patient ID: Victoria Morrison, female    DOB: [birth year], 79 y.o.   MRN: [id number]  This visit was conducted in person.  BP (!) 148/78 (BP Location: Right Arm, Cuff Size: Normal)   Pulse (!) 56   Temp (!) 97.5 F (36.4 C) (Oral)   Ht 5' 3 (1.6 m)   Wt 179 lb 3.2 oz (81.3 kg)   SpO2 97%   BMI 31.74 kg/m   BP Readings from Last 3 Encounters:  09/25/24 (!) 148/78  04/20/24 132/68  12/16/23 134/70   CC: CPE Subjective:   HPI: Victoria Morrison is a 79 y.o. female presenting on 09/25/2024 for Annual Exam, Foot Pain, and Hypertension   Saw health advisor Wednesday for medicare wellness visit. Note reviewed.   No results found.  Flowsheet Row Office Visit from 09/25/2024 in [facility] [place]  PHQ-2 Total Score 0       09/25/2024   11:42 AM 09/19/2024   10:19 AM 12/16/2023    2:04 PM 09/19/2023    3:37 PM 09/21/2022    9:54 AM  Fall Risk   Falls in the past year? 0 0  0 0  0  Number falls in past yr: 0 0  0    Injury with Fall? 0 0  0     Risk for fall due to : No Fall Risks No Fall Risks  No Fall Risks   Follow up Falls evaluation completed Falls evaluation completed;Education provided;Falls prevention discussed  Education provided;Falls prevention discussed      Manually entered by patient   Data saved with a previous flowsheet row definition    Intermittent sharp pain to L heel pain at sole most noted when she stands up.  Has seen podiatry Dr Janit for bilat plantar fasciitis Fall 2025. 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   Continues living with daughter locally in [place].  Regular with Doterra supplements - taking about 2000+ IU vit D.   Recently noted elevated blood pressures at home 150s/80s - this is despite amlodipine  5mg  daily. She got BP cuff and scale through [employer].    Hyperlipidemia - chronic, she continues pravastatin  nightly. Father with CAD/MI age 68. Crestor  caused worsening myalgias,  stiffness so she decide to return to pravastatin .   Reassuring exercise stress test  03/2024 - except for elevated blood pressures.  Reassuring echocardiogram 04/2024   Preventative: COLONOSCOPY 08/2019 - 3 HP, 1 TA, mod diverticulosis rpt 5 yrs Oma).  Well woman - last 01/2017, normal with absent endocervical zone due to atrophy. Always normal paps in past. Will stop cervical cancer screening.  Mammogram [facility]/2024 - [facility]. Gets yearly due to fmhx. DEXA Date: 12/2012 T score -1.3 femur, -1.9 radius.  DEXA 04/2020: R forearm -1.8, RFN -1.3. Takes bone supplement from Doterra. Continue walking regulary.  Lung cancer screening - not eligible.  Flu shot - declined  COVID vaccine - declined Tetanus - 11/2012  Pneumovax - 05/07/2012, prevnar-13 11/2013, prevnar-20 - deferred  Zostavax - 11/2012  RSV - declines Shingrix - discussed, declines Advanced directives - scanned 12/2015. HCPOA is daughter Devoria) then son Criss). Living will in chart.  Seat belt use discussed  Sunscreen use discussed, no changing moles on skin. Sees derm yearly. Recent BCC removed left upper lip.  Sleep - averaging 7 hours/night Non smoker  Alcohol - occasional  Dentist Q6 mo  Eye exam yearly  Bowel -  no constipation  Bladder - no incontinence  G4P4   Widower - husband Signe passed away 2019-06-26 from AML Parishioner at [employer] children, 3 sons, 1 daughter, 10 grandchildren Occupation: retired, was estate manager/land agent Edu: college Activity: walks regularly, active at [employer]  Diet: good water, vegetables daily     Relevant past medical, surgical, family and social history reviewed and updated as indicated. Interim medical history since our last visit reviewed. Allergies and medications reviewed and updated. Outpatient Medications Prior to Visit  Medication Sig Dispense Refill   Cholecalciferol (VITAMIN D3) 125 MCG (5000 UT) CAPS Take 1 capsule by mouth daily.     NON FORMULARY DoTerra  Lifelong Vitality Supplement     amLODipine  (NORVASC ) 5 MG tablet Take 1 tablet (5 mg total) by mouth daily. 90 tablet 4   pravastatin  (PRAVACHOL ) 40 MG tablet Take 1 tablet (40 mg total) by mouth daily.     rosuvastatin  (CRESTOR ) 10 MG tablet Take 1 tablet (10 mg total) by mouth daily. 90 tablet 3   No facility-administered medications prior to visit.     Per HPI unless specifically indicated in ROS section below Review of Systems  Constitutional:  Negative for chills and fever.  Cardiovascular:        No claudication    Objective:  BP (!) 148/78 (BP Location: Right Arm, Cuff Size: Normal)   Pulse (!) 56   Temp (!) 97.5 F (36.4 C) (Oral)   Ht 5' 3 (1.6 m)   Wt 179 lb 3.2 oz (81.3 kg)   SpO2 97%   BMI 31.74 kg/m   Wt Readings from Last 3 Encounters:  09/25/24 179 lb 3.2 oz (81.3 kg)  09/23/24 172 lb (78 kg)  05/01/24 172 lb (78 kg)      Physical Exam Vitals and nursing note reviewed.  Constitutional:      Appearance: Normal appearance. She is not ill-appearing.  HENT:     Head: Normocephalic and atraumatic.     Right Ear: Tympanic membrane, ear canal and external ear normal. There is no impacted cerumen.     Left Ear: Tympanic membrane, ear canal and external ear normal. There is no impacted cerumen.     Mouth/Throat:     Mouth: Mucous membranes are moist.     Pharynx: Oropharynx is clear. No oropharyngeal exudate or posterior oropharyngeal erythema.  Eyes:     General:        Right eye: No discharge.        Left eye: No discharge.     Extraocular Movements: Extraocular movements intact.     Conjunctiva/sclera: Conjunctivae normal.     Pupils: Pupils are equal, round, and reactive to light.  Neck:     Thyroid : No thyroid  mass or thyromegaly.     Vascular: No carotid bruit.  Cardiovascular:     Rate and Rhythm: Normal rate and regular rhythm.     Pulses: Normal pulses.     Heart sounds: Normal heart sounds. No murmur heard. Pulmonary:     Effort: Pulmonary  effort is normal. No respiratory distress.     Breath sounds: Normal breath sounds. No wheezing, rhonchi or rales.  Abdominal:     General: Bowel sounds are normal. There is no distension.     Palpations: Abdomen is soft. There is no mass.     Tenderness: There is no abdominal tenderness. There is no guarding or rebound.     Hernia: No hernia is present.  Musculoskeletal:  Cervical back: Normal range of motion and neck supple. No rigidity.     Right lower leg: No edema.     Left lower leg: No edema.  Lymphadenopathy:     Cervical: No cervical adenopathy.  Skin:    General: Skin is warm and dry.     Findings: No rash.  Neurological:     General: No focal deficit present.     Mental Status: She is alert. Mental status is at baseline.  Psychiatric:        Mood and Affect: Mood normal.        Behavior: Behavior normal.       Results for orders placed or performed in visit on 09/18/24  VITAMIN D  25 Hydroxy (Vit-D Deficiency, Fractures)   Collection Time: 09/18/24  7:44 AM  Result Value Ref Range   VITD 29.46 (L) 30.00 - 100.00 ng/mL  Microalbumin / creatinine urine ratio   Collection Time: 09/18/24  7:44 AM  Result Value Ref Range   Microalb, Ur <0.7 mg/dL   Creatinine,U 16.3 mg/dL   Microalb Creat Ratio Unable to calculate 0.0 - 30.0 mg/g  Comprehensive metabolic panel with GFR   Collection Time: 09/18/24  7:44 AM  Result Value Ref Range   Sodium 141 135 - 145 mEq/L   Potassium 4.2 3.5 - 5.1 mEq/L   Chloride 106 96 - 112 mEq/L   CO2 30 19 - 32 mEq/L   Glucose, Bld 92 70 - 99 mg/dL   BUN 17 6 - 23 mg/dL   Creatinine, Ser 9.20 0.40 - 1.20 mg/dL   Total Bilirubin 0.6 0.2 - 1.2 mg/dL   Alkaline Phosphatase 69 39 - 117 U/L   AST 17 5 - 37 U/L   ALT 14 3 - 35 U/L   Total Protein 6.4 6.0 - 8.3 g/dL   Albumin 4.2 3.5 - 5.2 g/dL   GFR 27.99 >39.99 mL/min   Calcium  9.3 8.4 - 10.5 mg/dL  Lipid panel   Collection Time: 09/18/24  7:44 AM  Result Value Ref Range    Cholesterol 209 (H) 28 - 200 mg/dL   Triglycerides 06.9 89.9 - 149.0 mg/dL   HDL 14.29 >60.99 mg/dL   VLDL 81.3 0.0 - 59.9 mg/dL   LDL Cholesterol 894 (H) 10 - 99 mg/dL   Total CHOL/HDL Ratio 2    NonHDL 123.56     Assessment & Plan:   Problem List Items Addressed This Visit     Advanced directives, counseling/discussion (Chronic)   Previously discussed      HTN (hypertension) - Primary   Chronic, deteriorated control in setting of holidays, weight gain, less active. Reviewed diet and lifestyle choices to improve blood pressure control.  She prefers to work on these changes then recheck prior to increase in BP control  RTC 3 mo HTN f/u visit.       Relevant Medications   amLODipine  (NORVASC ) 5 MG tablet   pravastatin  (PRAVACHOL ) 40 MG tablet   HLD (hyperlipidemia)   Chronic, back on pravastatin  as she had trouble tolerating rosuvastatin .  CCS 105 (56%) 03/2024.  Reassuring ETT, echo summer 2025.  The 10-year ASCVD risk score (Arnett DK, et al., 2019) is: 34%   Values used to calculate the score:     Age: 17 years     Clinically relevant sex: Female     Is Non-Hispanic African American: No     Diabetic: No     Tobacco smoker: No     Systolic Blood  Pressure: 148 mmHg     Is BP treated: Yes     HDL Cholesterol: 85.7 mg/dL     Total Cholesterol: 209 mg/dL       Relevant Medications   amLODipine  (NORVASC ) 5 MG tablet   pravastatin  (PRAVACHOL ) 40 MG tablet   Numbness of toes   Ongoing, without paresthesias, or burning pain.  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  Will be due for rpt DEXA end of 2025.       Agatston CAC score 100-199   Relevant Medications   amLODipine  (NORVASC ) 5 MG tablet   pravastatin  (PRAVACHOL ) 40 MG tablet   Pain of left heel   Anticipate plantar fasciitis, present over the past year.  Reviewed stretching, heel lift, frozen water bottle  massage. Handout provided. Update if not improving with this.       Other Visit Diagnoses       Special screening for malignant neoplasms, colon       Relevant Orders   Cologuard        Meds ordered this encounter  Medications   amLODipine  (NORVASC ) 5 MG tablet    Sig: Take 1 tablet (5 mg total) by mouth daily.    Dispense:  90 tablet    Refill:  3   pravastatin  (PRAVACHOL ) 40 MG tablet    Sig: Take 1 tablet (40 mg total) by mouth daily.    Dispense:  90 tablet    Refill:  3    Orders Placed This Encounter  Procedures   Cologuard    Patient Instructions  I will sign you up for cologuard.  You will be due for bone density scan at the end of the year.  Good to see you today Return as needed or in 3 months for blood pressure f/u visit   Your goal blood pressure is <140/90. Work on low salt/sodium diet - goal <2 grams (2,000mg ) per day. Eat a diet high in fruits/vegetables and whole grains.  Look into mediterranean and DASH diet. Goal activity is 113min/wk of moderate intensity exercise.  This can be split into 30 minute chunks.  If you are not at this level, you can start with smaller 10-15 min increments and slowly build up activity. Look at www.heart.org for more resources   Try plantar fasciitis treatment for left heel pain - frozen water bottle massage, gel insert heel lifts, stretching with towel  Follow up plan: Return in about 3 months (around 12/24/2024) for follow up visit.  Anton Blas, MD   "

## 2024-09-25 NOTE — Assessment & Plan Note (Addendum)
 Anticipate plantar fasciitis, present over the past year.  Reviewed stretching, heel lift, frozen water bottle massage. Handout provided. Update if not improving with this.

## 2024-09-25 NOTE — Patient Instructions (Addendum)
 I will sign you up for cologuard.  You will be due for bone density scan at the end of the year.  Good to see you today Return as needed or in 3 months for blood pressure f/u visit   Your goal blood pressure is <140/90. Work on low salt/sodium diet - goal <2 grams (2,000mg ) per day. Eat a diet high in fruits/vegetables and whole grains.  Look into mediterranean and DASH diet. Goal activity is 175min/wk of moderate intensity exercise.  This can be split into 30 minute chunks.  If you are not at this level, you can start with smaller 10-15 min increments and slowly build up activity. Look at www.heart.org for more resources   Try plantar fasciitis treatment for left heel pain - frozen water bottle massage, gel insert heel lifts, stretching with towel

## 2024-09-25 NOTE — Assessment & Plan Note (Addendum)
 Ongoing, without paresthesias, or burning pain.  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

## 2024-12-22 ENCOUNTER — Ambulatory Visit: Admitting: Family Medicine

## 2025-09-20 ENCOUNTER — Other Ambulatory Visit

## 2025-09-27 ENCOUNTER — Encounter: Admitting: Family Medicine

## 2025-09-27 ENCOUNTER — Ambulatory Visit
